# Patient Record
Sex: Female | Born: 1970 | Race: White | Hispanic: No | Smoking: Never smoker
Health system: Southern US, Community
[De-identification: ages and names within clinical notes are randomized; demographics above are authoritative.]

## PROBLEM LIST (undated history)

## (undated) DIAGNOSIS — C4491 Basal cell carcinoma of skin, unspecified: Secondary | ICD-10-CM

## (undated) DIAGNOSIS — C439 Malignant melanoma of skin, unspecified: Secondary | ICD-10-CM

## (undated) HISTORY — PX: BASAL CELL CARCINOMA EXCISION: SHX1214

## (undated) HISTORY — DX: Malignant melanoma of skin, unspecified: C43.9

## (undated) HISTORY — DX: Basal cell carcinoma of skin, unspecified: C44.91

---
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
## 1998-11-25 ENCOUNTER — Other Ambulatory Visit: Admission: RE | Admit: 1998-11-25 | Discharge: 1998-11-25 | Payer: Self-pay | Admitting: Gynecology

## 1999-06-10 ENCOUNTER — Inpatient Hospital Stay (HOSPITAL_COMMUNITY): Admission: AD | Admit: 1999-06-10 | Discharge: 1999-06-12 | Payer: Self-pay | Admitting: Gynecology

## 1999-06-22 ENCOUNTER — Ambulatory Visit (HOSPITAL_COMMUNITY): Admission: AD | Admit: 1999-06-22 | Discharge: 1999-06-22 | Payer: Self-pay | Admitting: Gynecology

## 1999-06-22 ENCOUNTER — Encounter (INDEPENDENT_AMBULATORY_CARE_PROVIDER_SITE_OTHER): Payer: Self-pay

## 1999-07-17 ENCOUNTER — Other Ambulatory Visit: Admission: RE | Admit: 1999-07-17 | Discharge: 1999-07-17 | Payer: Self-pay | Admitting: Gynecology

## 2001-06-13 ENCOUNTER — Other Ambulatory Visit: Admission: RE | Admit: 2001-06-13 | Discharge: 2001-06-13 | Payer: Self-pay | Admitting: Gynecology

## 2002-05-14 ENCOUNTER — Encounter: Admission: RE | Admit: 2002-05-14 | Discharge: 2002-05-14 | Payer: Self-pay | Admitting: Gynecology

## 2002-05-14 ENCOUNTER — Encounter: Payer: Self-pay | Admitting: Gynecology

## 2005-07-04 ENCOUNTER — Other Ambulatory Visit: Admission: RE | Admit: 2005-07-04 | Discharge: 2005-07-04 | Payer: Self-pay | Admitting: Gynecology

## 2007-08-14 HISTORY — PX: BREAST ENHANCEMENT SURGERY: SHX7

## 2009-06-03 ENCOUNTER — Other Ambulatory Visit: Admission: RE | Admit: 2009-06-03 | Discharge: 2009-06-03 | Payer: Self-pay | Admitting: Gynecology

## 2009-06-03 ENCOUNTER — Encounter: Payer: Self-pay | Admitting: Women's Health

## 2009-06-03 ENCOUNTER — Ambulatory Visit: Payer: Self-pay | Admitting: Women's Health

## 2009-06-17 ENCOUNTER — Encounter: Admission: RE | Admit: 2009-06-17 | Discharge: 2009-06-17 | Payer: Self-pay | Admitting: Gynecology

## 2009-09-01 ENCOUNTER — Ambulatory Visit: Payer: Self-pay | Admitting: Family Medicine

## 2009-11-09 ENCOUNTER — Ambulatory Visit: Payer: Self-pay | Admitting: Family Medicine

## 2010-01-03 ENCOUNTER — Encounter: Admission: RE | Admit: 2010-01-03 | Discharge: 2010-01-03 | Payer: Self-pay | Admitting: Gynecology

## 2011-11-13 ENCOUNTER — Other Ambulatory Visit: Payer: Self-pay | Admitting: Gynecology

## 2013-12-28 ENCOUNTER — Emergency Department (INDEPENDENT_AMBULATORY_CARE_PROVIDER_SITE_OTHER)
Admission: EM | Admit: 2013-12-28 | Discharge: 2013-12-28 | Disposition: A | Discharge status: Home / Self Care | Payer: BC Managed Care – PPO | Source: Home / Self Care | Attending: Emergency Medicine | Admitting: Emergency Medicine

## 2013-12-28 ENCOUNTER — Encounter (HOSPITAL_COMMUNITY): Payer: Self-pay | Admitting: Emergency Medicine

## 2013-12-28 ENCOUNTER — Emergency Department (INDEPENDENT_AMBULATORY_CARE_PROVIDER_SITE_OTHER): Payer: BC Managed Care – PPO

## 2013-12-28 DIAGNOSIS — S6000XA Contusion of unspecified finger without damage to nail, initial encounter: Secondary | ICD-10-CM

## 2013-12-28 DIAGNOSIS — S60111A Contusion of right thumb with damage to nail, initial encounter: Secondary | ICD-10-CM

## 2013-12-28 MED ORDER — HYDROCODONE-ACETAMINOPHEN 5-325 MG PO TABS
ORAL_TABLET | ORAL | Status: AC
  Filled 2013-12-28: qty 2

## 2013-12-28 MED ORDER — DOXYCYCLINE HYCLATE 100 MG PO TABS: 100.0000 mg | ORAL_TABLET | Freq: Two times a day (BID) | ORAL | Status: DC

## 2013-12-28 MED ORDER — HYDROCODONE-ACETAMINOPHEN 5-325 MG PO TABS
2.0000 | ORAL_TABLET | Freq: Once | ORAL | Status: AC
  Administered 2013-12-28: 2 via ORAL

## 2013-12-28 MED ORDER — HYDROCODONE-ACETAMINOPHEN 5-325 MG PO TABS: ORAL_TABLET | ORAL | Status: DC

## 2013-12-28 NOTE — ED Provider Notes (Signed)
Chief Complaint   Chief Complaint  Patient presents with  . Hand Injury    History of Present Illness   Diane Benjamin is a 43 year old female who slammed her right thumb in the [place] door [place] [employer] this past Thursday, 5 days ago. Ever since then she's had swelling and discoloration beneath the thumbnail. It hurts to move the thumb. She denies any fever or chills.  Review of Systems   Other than as noted above, the patient denies any of the following symptoms: Systemic:  No fevers or chills. Musculoskeletal:  No joint pain or arthritis.  Neurological:  No muscular weakness or paresthesias.  [place]   Past medical history, family history, social history, meds, and allergies were reviewed.     Physical Examination   Vital signs:  BP 120/80  Pulse 68  Temp(Src) 97 F (36.1 C) (Oral)  Resp 14  SpO2 100%  LMP 12/24/2013 Gen:  Alert and oriented times 3.  In no distress. Musculoskeletal:  Exam of the hand reveals there is a subungual hematoma. The proximal nail fold is swollen and erythematous. The nail is tender to touch. She has limited range of motion of the interphalangeal joint.  Otherwise, all joints had a full a ROM with no swelling, bruising or deformity.  No edema, pulses full. Extremities were warm and pink.  Capillary refill was brisk.  Skin:  Clear, warm and dry.  No rash. Neuro:  Alert and oriented times 3.  Muscle strength was normal.  Sensation was intact to light touch.   Radiology   Dg Finger Thumb Right  12/28/2013   CLINICAL DATA:  Right thumb injury and pain.  EXAM: RIGHT THUMB 2+V  COMPARISON:  None.  FINDINGS: There is no evidence of fracture or dislocation. There is no evidence of arthropathy or other focal bone abnormality. Soft tissues are unremarkable  IMPRESSION: Negative.   Electronically Signed   By: Hassan Rowan M.D.   On: 12/28/2013 20:09   I reviewed the images independently and personally and concur with the radiologist's findings.  Course in [facility] [place] was prepped with alcohol and 2 small holes were made at the base of the thumbnail draining a large amount of blood. There was no pus. Antibiotic ointment was applied and a sterile dressing. The thumb was put in a cage and wrapped in [place].  Assessment   The encounter diagnosis was Subungual hematoma of right thumb.  She will probably lose the nail. She was instructed in wound care. To avoid any further trauma to the nail. Return again if there is any sign of infection.  Plan  1.  Meds:  The following meds were prescribed:   Discharge Medication List as of 12/28/2013  8:26 PM    START taking these medications   Details  doxycycline (VIBRA-TABS) 100 MG tablet Take 1 tablet (100 mg total) by mouth 2 (two) times daily., Starting 12/28/2013, Until Discontinued, Normal    HYDROcodone-acetaminophen (NORCO/VICODIN) 5-325 MG per tablet 1 to 2 tabs every 4 to 6 hours as needed for pain., Print        2.  Patient Education/Counseling:  The patient was given appropriate handouts, self care instructions, and instructed in symptomatic relief, including rest and activity, and elevation.   3.  Follow up:  The patient was told to follow up here if no better in 3 to 4 days, or sooner if becoming worse in any way, and given some red flag symptoms  such as worsening pain, fever, swelling, or neurological symptoms which would prompt immediate return.        Harden Mo, MD 12/28/13 2049

## 2013-12-28 NOTE — ED Notes (Signed)
Report injury to right thumb.  States slammed thumb in car door Thursday night.  Having pain and swelling.  Hematoma noted.  No relief with otc pain meds.

## 2013-12-28 NOTE — Discharge Instructions (Signed)
Subungual Hematoma °A subungual hematoma is a pocket of blood that collects under the fingernail or toenail. The pressure created by the blood under the nail can cause pain. °CAUSES  °A subungual hematoma occurs when an injury to the finger or toe causes a blood vessel beneath the nail to break. The injury can occur from a direct blow such as slamming a finger in a door. It can also occur from a repeated injury such as pressure on the foot in a shoe while running. A subungual hematoma is sometimes called runner's toe or tennis toe. °SYMPTOMS  °· Blue or dark blue skin under the nail. °· Pain or throbbing in the injured area. °DIAGNOSIS  °Your caregiver can determine whether you have a subungual hematoma based on your history and a physical exam. If your caregiver thinks you might have a broken (fractured) bone, X-rays may be taken. °TREATMENT  °Hematomas usually go away on their own over time. Your caregiver may make a hole in the nail to drain the blood. Draining the blood is painless and usually provides significant relief from pain and throbbing. The nail usually grows back normally after this procedure. In some cases, the nail may need to be removed. This is done if there is a cut under the nail that requires stitches (sutures). °HOME CARE INSTRUCTIONS  °· Put ice on the injured area. °· Put ice in a plastic bag. °· Place a towel between your skin and the bag. °· Leave the ice on for 15-20 minutes, 03-04 times a day for the first 1 to 2 days. °· Elevate the injured area to help decrease pain and swelling. °· If you were given a bandage, wear it for as long as directed by your caregiver. °· If part of your nail falls off, trim the remaining nail gently. This prevents the nail from catching on something and causing further injury. °· Only take over-the-counter or prescription medicines for pain, discomfort, or fever as directed by your caregiver. °SEEK IMMEDIATE MEDICAL CARE IF:  °· You have redness or swelling  around the nail. °· You have yellowish-white fluid (pus) coming from the nail. °· Your pain is not controlled with medicine. °· You have a fever. °MAKE SURE YOU: °· Understand these instructions. °· Will watch your condition. °· Will get help right away if you are not doing well or get worse. °Document Released: 07/27/2000 Document Revised: 10/22/2011 Document Reviewed: 07/18/2011 °ExitCare® Patient Information ©2014 [employer]. ° °

## 2015-02-11 ENCOUNTER — Ambulatory Visit: Payer: Self-pay | Admitting: Women's Health

## 2015-03-04 ENCOUNTER — Ambulatory Visit: Payer: Self-pay | Admitting: Women's Health

## 2015-03-29 ENCOUNTER — Other Ambulatory Visit (HOSPITAL_COMMUNITY)
Admission: RE | Admit: 2015-03-29 | Discharge: 2015-03-29 | Disposition: A | Discharge status: Home / Self Care | Payer: BC Managed Care – PPO | Source: Ambulatory Visit | Attending: Women's Health | Admitting: Women's Health

## 2015-03-29 ENCOUNTER — Encounter: Payer: Self-pay | Admitting: Women's Health

## 2015-03-29 ENCOUNTER — Ambulatory Visit (INDEPENDENT_AMBULATORY_CARE_PROVIDER_SITE_OTHER): Payer: BC Managed Care – PPO | Admitting: Women's Health

## 2015-03-29 VITALS — BP 126/80 | Ht 64.0 in | Wt 113.0 lb

## 2015-03-29 DIAGNOSIS — Z1151 Encounter for screening for human papillomavirus (HPV): Secondary | ICD-10-CM | POA: Insufficient documentation

## 2015-03-29 DIAGNOSIS — Z01419 Encounter for gynecological examination (general) (routine) without abnormal findings: Secondary | ICD-10-CM | POA: Diagnosis not present

## 2015-03-29 DIAGNOSIS — Z13220 Encounter for screening for lipoid disorders: Secondary | ICD-10-CM | POA: Diagnosis not present

## 2015-03-29 DIAGNOSIS — Z23 Encounter for immunization: Secondary | ICD-10-CM | POA: Diagnosis not present

## 2015-03-29 NOTE — Progress Notes (Signed)
Diane Benjamin 1970/08/30 [id number]    History:    Presents for annual exam.  Minimal healthcare for the past few years. History of normal Paps. Has not had a mammogram. Regular monthly cycle withdrawal for contraception. Occasionally will have brown odorless, non-irritating, painless discharge about 5 days prior to cycle. Mother breast cancer age 44 with recurrence at age 12 is planning to have BRCA testing. Sister  had negative BRCA testing.  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 Originally from [place], went to [place]..  ROS:  A ROS was performed and pertinent positives and negatives are included.  Exam:  Filed Vitals:   03/29/15 1544  BP: 126/80    General appearance:  Normal Thyroid:  Symmetrical, normal in size, without palpable masses or nodularity. Respiratory  Auscultation:  Clear without wheezing or rhonchi Cardiovascular  Auscultation:  Regular rate, without rubs, murmurs or gallops  Edema/varicosities:  Not grossly evident Abdominal  Soft,nontender, without masses, guarding or rebound.  Liver/spleen:  No organomegaly noted  Hernia:  None appreciated  Skin  Inspection:  Grossly normal   Breasts: Examined lying and sitting/bilateral saline implants.     Right: Without masses, retractions, discharge or axillary adenopathy.     Left: Without masses, retractions, discharge or axillary adenopathy. Gentitourinary   Inguinal/mons:  Normal without inguinal adenopathy  External genitalia:  Normal  BUS/Urethra/Skene's glands:  Normal  Vagina:  Normal no discharge visible.  Cervix:  Normal  Uterus:  normal in size, shape and contour.  Midline and mobile  Adnexa/parametria:     Rt: Without masses or tenderness.   Lt: Without masses or tenderness.  Anus and perineum: Normal  Digital rectal exam: Normal sphincter tone without  palpated masses or tenderness  Assessment/Plan:  44 y.o. M WF G2 P2 for annual exam.   Monthly cycle/withdrawal/occasional brown discharge week prior to cycle  Plan: Options reviewed, sonohysterogram, watch, reviewed if red/bloody discharge/spotting persist best to proceed with a sonohysterogram. Will call if continues. SBE's, reviewed importance of annual screening mammogram, 3-D tomography reviewed and encouraged. Contraception reviewed, declines will continue with withdrawal. Continue active lifestyle, sunscreens encouraged, regular exercise, calcium rich diet, vitamin D 1000 daily encouraged. CBC, glucose, lipid panel, TSH, UA, Pap with HR HPV typing. New screening guidelines reviewed. T dap given.  [place], 4:59 PM 03/29/2015

## 2015-03-29 NOTE — Patient Instructions (Signed)

## 2015-03-30 LAB — URINALYSIS W MICROSCOPIC + REFLEX CULTURE
Bilirubin Urine: NEGATIVE
CASTS: NONE SEEN [LPF]
CRYSTALS: NONE SEEN [HPF]
Glucose, UA: NEGATIVE
Hgb urine dipstick: NEGATIVE
KETONES UR: NEGATIVE
NITRITE: NEGATIVE
PH: 6 (ref 5.0–8.0)
Protein, ur: NEGATIVE
RBC / HPF: NONE SEEN RBC/HPF (ref <=2)
SPECIFIC GRAVITY, URINE: 1.007 (ref 1.001–1.035)
YEAST: NONE SEEN [HPF]

## 2015-03-30 LAB — CBC WITH DIFFERENTIAL/PLATELET
BASOS ABS: 0.1 10*3/uL (ref 0.0–0.1)
Basophils Relative: 1 % (ref 0–1)
EOS ABS: 0.2 10*3/uL (ref 0.0–0.7)
EOS PCT: 3 % (ref 0–5)
HEMATOCRIT: 39.7 % (ref 36.0–46.0)
Hemoglobin: 13.2 g/dL (ref 12.0–15.0)
LYMPHS PCT: 26 % (ref 12–46)
Lymphs Abs: 1.6 10*3/uL (ref 0.7–4.0)
MCH: 30.6 pg (ref 26.0–34.0)
MCHC: 33.2 g/dL (ref 30.0–36.0)
MCV: 91.9 fL (ref 78.0–100.0)
MPV: 10.5 fL (ref 8.6–12.4)
Monocytes Absolute: 0.4 10*3/uL (ref 0.1–1.0)
Monocytes Relative: 6 % (ref 3–12)
NEUTROS PCT: 64 % (ref 43–77)
Neutro Abs: 3.8 10*3/uL (ref 1.7–7.7)
PLATELETS: 228 10*3/uL (ref 150–400)
RBC: 4.32 MIL/uL (ref 3.87–5.11)
RDW: 13.6 % (ref 11.5–15.5)
WBC: 6 10*3/uL (ref 4.0–10.5)

## 2015-03-30 LAB — LIPID PANEL
CHOLESTEROL: 154 mg/dL (ref 125–200)
HDL: 65 mg/dL (ref >=46)
LDL Cholesterol: 74 mg/dL (ref <130)
TRIGLYCERIDES: 76 mg/dL (ref <150)
Total CHOL/HDL Ratio: 2.4 Ratio (ref <=5.0)
VLDL: 15 mg/dL (ref <30)

## 2015-03-30 LAB — GLUCOSE, RANDOM: Glucose, Bld: 86 mg/dL (ref 65–99)

## 2015-03-30 LAB — TSH: TSH: 2.288 u[IU]/mL (ref 0.350–4.500)

## 2015-03-31 LAB — CYTOLOGY - PAP

## 2015-03-31 LAB — URINE CULTURE

## 2015-05-27 ENCOUNTER — Other Ambulatory Visit: Payer: Self-pay

## 2015-05-27 DIAGNOSIS — Z1231 Encounter for screening mammogram for malignant neoplasm of breast: Secondary | ICD-10-CM

## 2015-06-24 ENCOUNTER — Ambulatory Visit
Admission: RE | Admit: 2015-06-24 | Discharge: 2015-06-24 | Disposition: A | Discharge status: Home / Self Care | Payer: BC Managed Care – PPO | Source: Ambulatory Visit

## 2015-06-24 DIAGNOSIS — Z1231 Encounter for screening mammogram for malignant neoplasm of breast: Secondary | ICD-10-CM

## 2016-12-26 ENCOUNTER — Encounter: Payer: Self-pay | Admitting: Gynecology

## 2017-01-21 ENCOUNTER — Ambulatory Visit (INDEPENDENT_AMBULATORY_CARE_PROVIDER_SITE_OTHER): Payer: BC Managed Care – PPO | Admitting: Medical

## 2017-01-21 ENCOUNTER — Encounter: Payer: Self-pay | Admitting: Medical

## 2017-01-21 VITALS — BP 108/62 | HR 64 | Temp 98.4°F | Ht 63.0 in | Wt 121.0 lb

## 2017-01-21 DIAGNOSIS — L559 Sunburn, unspecified: Secondary | ICD-10-CM | POA: Diagnosis not present

## 2017-01-21 DIAGNOSIS — W57XXXA Bitten or stung by nonvenomous insect and other nonvenomous arthropods, initial encounter: Secondary | ICD-10-CM | POA: Diagnosis not present

## 2017-01-21 NOTE — Progress Notes (Signed)
Subjective: Chief Complaint  Patient presents with  . New Patient (Initial Visit)    tick bite  swelling on rt hip , no fever,no vomitting, no nausea    Here as a new patient today.  Used to come here in the past, saw Dr. Redmond [facility] in the past 8-10 years ago.   Bit by tick over the weekend.  She was out near the wooded area the night before, so she thinks the tick was there less than 24 hours.   Pulled the tick off as soon as she noticed it.  Tick was on right hip area, and has localized puffiness in right groin.  Has the tick in a zip lock bag today.   Denies fever, joint aches, fatigue.  She is sore, but thinks its from walking a lot this past week during EOGs.    Has sunburn, was out in sun a lot yesterday and today.     Is an Statistician at [employer], teaches PE.    Has 46yo daughter and 74yo son, married.  No urinary or bowel or vaginal c/o.   No other aggravating or relieving factors. No other complaint.  Of note, she has had several mammograms.   Mother has had breast cancer twice, first time diagnosed in her 55s.  She is in her 49s now.  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 No pertinent past medical history.   No current outpatient prescriptions on file prior to visit.   No current facility-administered medications on file prior to visit.     ROS as in subjective  Objective: BP 108/62   Pulse 64   Temp 98.4 F (36.9 C)   Ht 5\' 3"  (1.6 m)   Wt 121 lb (54.9 kg)   BMI 21.43 kg/m   Gen: wd, wn, nad, lean white female Right anterior hip with small 0.5 cm diameter raised erythematous area with central 54mm skin patch missing from where tick was pulled off.    Similar lesion right posterior back.   There is slight puffiness and shoddy mildly tender lymph nodes palpable in right inguinal region Mild sun burn on arms, neck,  face   Assessment: Encounter Diagnoses  Name Primary?  . Tick bite, initial encounter Yes  . Sunburn     Plan: Discussed concerns, symptoms, exam findings.   We will use watchful waiting regarding tick born illness, but currently no symptoms of tick borne illness.  Recommendations:  You can use OTC hydrocortisone cream topically to the bites for the next week  Begin Benadryl tablet OTC 25mg  at night, and possible 12.5mg  (1/2 tablet ) benadryl in the day  You can use a combination of ice/heat to the tick bite areas  You can use OTC ibuprofen 2-3 tablets twice daily for a week for the localized lymph node swelling  If you develop fever, joint aches, joint swelling, fatigue, or new rash, then call or recheck right away  Diane Benjamin was seen today for new patient (initial visit).  Diagnoses and all orders for this visit:  Tick bite, initial encounter  Sunburn

## 2017-01-21 NOTE — Patient Instructions (Addendum)
Recommendations:  You can use OTC hydrocortisone cream topically to the bites for the next week  Begin Benadryl tablet OTC 25mg  at night, and possible 12.5mg  (1/2 tablet ) benadryl in the day  You can use a combination of ice/heat to the tick bite areas  You can use OTC ibuprofen 2-3 tablets twice daily for a week for the localized lymph node swelling  If you develop fever, joint aches, joint swelling, fatigue, or new rash, then call or recheck right away     Lyme Disease Lyme disease is an infection that affects many parts of the body, including the skin, joints, and nervous system. It is a bacterial infection that starts from the bite of an infected tick. The infection can spread, and some of the symptoms are similar to the flu. If Lyme disease is not treated, it may cause joint pain, swelling, numbness, problems thinking, fatigue, muscle weakness, and other problems. What are the causes? This condition is caused by bacteria called Borrelia burgdorferi. You can get Lyme disease by being bitten by an infected tick. The tick must be attached to your skin to pass along the infection. Deer often carry infected ticks. What increases the risk? The following factors may make you more likely to develop this condition:  Living in or visiting these areas in the [place] ? [employer]. ? The [place]. ? The [place].  Spending time in wooded or grassy areas.  Being outdoors with exposed skin.  Camping, gardening, hiking, fishing, or hunting outdoors.  Failing to remove a tick from your skin within 3-4 days.  What are the signs or symptoms? Symptoms of this condition include:  A round, red rash that surrounds the center of the tick bite. This is the first sign of infection. The center of the rash may be blood colored or have tiny blisters.  Fatigue.  Headache.  Chills and fever.  General achiness.  Joint pain, often in the knees.  Muscle pain.  Swollen lymph  glands.  Stiff neck.  How is this diagnosed? This condition is diagnosed based on:  Your symptoms and medical history.  A physical exam.  A blood test.  How is this treated? The main treatment for this condition is antibiotic medicine, which is usually taken by mouth (orally). The length of treatment depends on how soon after a tick bite you begin taking the medicine. In some cases, treatment is necessary for several weeks. If the infection is severe, antibiotics may need to be given through an IV tube that is inserted into one of your veins. Follow these instructions at home:  Take your antibiotic medicine as told by your health care provider. Do not stop taking the antibiotic even if you start to feel better.  Ask your health care provider about takinga probiotic in between doses of your antibiotic to help avoid stomach upset or diarrhea.  Check with your health care provider before supplementing your treatment. Many alternative therapies have not been proven and may be harmful to you.  Keep all follow-up visits as told by your health care provider. This is important. How is this prevented? You can become reinfected if you get another tick bite from an infected tick. Take these steps to help prevent an infection:  Cover your skin with light-colored clothing when you are outdoors in the spring and summer months.  Spray clothing and skin with bug spray. The spray should be 20-30% DEET.  Avoid wooded, grassy, and shaded areas.  Remove yard  litter, brush, trash, and plants that attract deer and rodents.  Check yourself for ticks when you come indoors.  Wash clothing worn each day.  Check your pets for ticks before they come inside.  If you find a tick: ? Remove it with tweezers. ? Clean your hands and the bite area with rubbing alcohol or soap and water.  Pregnant women should take special care to avoid tick bites because the infection can be passed along to the  fetus. Contact a health care provider if:  You have symptoms after treatment.  You have removed a tick and want to bring it to your health care provider for testing. Get help right away if:  You have an irregular heartbeat.  You have nerve pain.  Your face feels numb. This information is not intended to replace advice given to you by your health care provider. Make sure you discuss any questions you have with your health care provider. Document Released: 11/05/2000 Document Revised: 03/20/2016 Document Reviewed: 03/20/2016 Elsevier Interactive Patient Education  2017 [place] Spotted Fever [facility] spotted fever is an illness that is spread to people by infected ticks. The illness causes flulike symptoms and a reddish-purple rash. This illness can quickly become very serious. Treatment must be started right away. When the illness is not treated right away, it can sometimes lead to long-term health problems or even death. This illness is most common during warm weather when ticks are most active. What are the causes? [facility] spotted fever is caused by a type of bacteria that is called Rickettsia rickettsii. This type of bacteria is carried by [place] dog ticks and [employer]. People get infected through a bite from a tick that is infected with the bacteria. The bite is painless, and it frequently goes unnoticed. The bacteria can also infect a person when tick blood or tick feces get into a person's body through damaged skin. A tick bite is not necessary for an infection to occur. People can get [facility] spotted fever if they get a tick's blood or body fluids on their skin in the area of a small cut or sore. This could happen while removing a tick from another person or a dog. The infection is not contagious, and it cannot be spread (transmitted) from person to person. What are the signs or symptoms? Symptoms may begin 2-14 days  after a tick bite. The most common early symptoms are:  Fever.  Muscle aches.  Headache.  Nausea.  Vomiting.  Poor appetite.  Abdominal pain.  The reddish-purple rash usually appears 3-5 days after the first symptoms begin. The rash often starts on the wrists and ankles. It may then spread to the palms, the soles of the feet, the legs, and the trunk. How is this diagnosed? Diagnosis is based on a physical exam, medical history, and blood tests. Your health care provider may suspect [facility] spotted fever in one of these cases:  If you have recently been bitten by a tick.  If you have been in areas that have a lot of ticks or in areas where the disease is common.  How is this treated? It is important to begin treatment right away. Treatment will usually involve the use of antibiotic medicines. In some cases, your health care provider may begin treatment before the diagnosis is confirmed. If your symptoms are severe, a hospital stay may be needed. Follow these instructions at home:  Rest as  much as possible until you feel better.  Take medicines only as directed by your health care provider.  Take your antibiotic medicine as directed by your health care provider. Finish the antibiotic even if you start to feel better.  Drink enough fluid to keep your urine clear or pale yellow.  Keep all follow-up visits as directed by your health care provider. This is important. How is this prevented? Avoiding tick bites can help to prevent this illness. Take these steps to avoid tick bites when you are outdoors:  Be aware that most ticks live in shrubs, low tree branches, and grassy areas. A tick can climb onto your body when you make contact with leaves or grass where the tick is waiting.  Wear protective clothing. Long sleeves and long pants are best.  Wear white clothes so you can see ticks more easily.  Tuck your pant legs into your socks.  If you go walking on a trail, stay  in the middle of the trail to avoid brushing against bushes.  Avoid walking through areas that have long grass.  Put insect repellent on all exposed skin and along boot tops, pant legs, and sleeve cuffs.  Check clothing, hair, and skin repeatedly and before going inside.  Check family members and pets for ticks.  Brush off any ticks that are not attached.  Take a shower or a bath as soon as possible after you have been outdoors. Check your skin for ticks. The most common places on the body where ticks attach themselves are the scalp, neck, armpits, waist, and groin.  You can also greatly reduce your chances of getting [facility] spotted fever if you remove attached ticks as soon as possible. To remove an attached tick, use a forceps or fine-point tweezers to detach the intact tick without leaving its mouth parts in the skin. The wound from the tick bite should be washed after the tick has been removed. Contact a health care provider if:  You have drainage, swelling, or increased redness or pain in the area of the rash. Get help right away if:  You have chest pain.  You have shortness of breath.  You have a severe headache.  You have a seizure.  You have severe abdominal pain.  You are feeling confused.  You are bruising easily.  You have bleeding from your gums.  You have blood in your stool. This information is not intended to replace advice given to you by your health care provider. Make sure you discuss any questions you have with your health care provider. Document Released: 11/11/2000 Document Revised: 01/05/2016 Document Reviewed: 03/15/2014 Elsevier Interactive Patient Education  2018 [employer].

## 2017-08-28 ENCOUNTER — Telehealth: Payer: Self-pay | Admitting: Medical

## 2017-08-28 ENCOUNTER — Ambulatory Visit: Payer: BC Managed Care – PPO | Admitting: Medical

## 2017-08-28 VITALS — BP 110/68 | HR 78 | Temp 98.3°F | Wt 122.2 lb

## 2017-08-28 DIAGNOSIS — I889 Nonspecific lymphadenitis, unspecified: Secondary | ICD-10-CM | POA: Diagnosis not present

## 2017-08-28 DIAGNOSIS — Z8349 Family history of other endocrine, nutritional and metabolic diseases: Secondary | ICD-10-CM | POA: Diagnosis not present

## 2017-08-28 DIAGNOSIS — J0110 Acute frontal sinusitis, unspecified: Secondary | ICD-10-CM | POA: Diagnosis not present

## 2017-08-28 DIAGNOSIS — R059 Cough, unspecified: Secondary | ICD-10-CM

## 2017-08-28 DIAGNOSIS — R05 Cough: Secondary | ICD-10-CM | POA: Diagnosis not present

## 2017-08-28 DIAGNOSIS — R5383 Other fatigue: Secondary | ICD-10-CM | POA: Insufficient documentation

## 2017-08-28 MED ORDER — BENZONATATE 200 MG PO CAPS: 200.0000 mg | ORAL_CAPSULE | Freq: Three times a day (TID) | ORAL | 0 refills | Status: DC | PRN

## 2017-08-28 MED ORDER — AMOXICILLIN 875 MG PO TABS: 875.0000 mg | ORAL_TABLET | Freq: Two times a day (BID) | ORAL | 0 refills | Status: DC

## 2017-08-28 NOTE — Telephone Encounter (Signed)
Called pt per Shane's instruction and left message to call our office to let us know if she needs a work note her for today. We can provide a note per Audelia Acton.

## 2017-08-28 NOTE — Progress Notes (Signed)
Subjective: Chief Complaint  Patient presents with  . Sinus Problem    sinus pressure in head, coughing, chest congestion , coughing up mucus ,x1 month    Here for month long hx/o cough, some headache, low energy, coughing for about a month, productive last few days.  Last few days worse headache and sinus pressure, upper teeth aches, some chills and body aches.  Started as sore throat and chest hurts from all the coughing.   She notes years ago having a cough so bad she broke a rib.   No NV.  Had some loose stool few weeks ago.  Has recently used Alka seltzer, Theraflu, delsym, robitussin.  Had sick contact over Christmas.     I saw her last year for a tick bite.  Since then she continues to have a small area of puffiness in her right inguinal region.  But she never had rash or fever or significant signs of Lyme disease after I saw her  However she does note several month history of fatigue, has some family history of thyroid disease including first-degree relatives.  Her periods have slowed down in the last year, last period months ago but does not think she is pregnant and has had negative pregnancy test at home.  She does get hot flashes.  She has had puffiness in achiness behind the knees from time to time  She denies fever, weight loss, night sweats.  No past medical history on file.   No current outpatient medications on file prior to visit.   No current facility-administered medications on file prior to visit.    ROS as in subjective    Objective: BP 110/68   Pulse 78   Temp 98.3 F (36.8 C)   Wt 122 lb 3.2 oz (55.4 kg)   SpO2 97%   BMI 21.65 kg/m   Wt Readings from Last 3 Encounters:  08/28/17 122 lb 3.2 oz (55.4 kg)  01/21/17 121 lb (54.9 kg)  03/29/15 113 lb (51.3 kg)   General appearance: Alert, WD/WN, no distress                             Skin: warm, no rash                           Head: + frontal sinus tenderness,                            Eyes:  conjunctiva normal, corneas clear, PERRLA                            Ears: pearly TMs, external ear canals normal                          Nose: septum midline, turbinates swollen, with erythema and clear discharge             Mouth/throat: MMM, tongue normal, mild pharyngeal erythema                           Neck: supple,shoddy tender nodes, no thyromegaly, non tender                          Heart: RRR, normal S1,  S2, no murmurs                         Lungs: CTA bilaterally, no wheezes, rales, or rhonchi    chest wall: tender over anterior lower ribs bilat, no deformity, no bruising or redness  ext: no edema Pulses WNL There is a small palpable 4-5 mm diameter lymph node in right inguinal region that causes a little puffiness but she is very lean making this easy to see.  No enlarged node.   Exam chaperoned by nurse   Assessment: Encounter Diagnoses  Name Primary?  . Cough Yes  . Acute non-recurrent frontal sinusitis   . Lymphadenitis   . Fatigue, unspecified type   . Family history of thyroid disease     Plan: We discussed her symptoms and concerns.  Her current symptoms do suggest sinus infection postnasal drainage and cough.  We discussed possibly getting an x-ray but she declines today.  She will begin a round of amoxicillin, discussed possible cross reaction given she has a prior allergy to Keflex.  Advised rest, hydration, over-the-counter analgesic, can use Tessalon as needed  If still coughing after 10 days of more than we can get an x-ray if not much improved  Fatigue, lymphadenitis, family history of thyroid disease-labs today  The right inguinal lymph node is small and likely residual from the tick bite but not with current major concern for other pathology.  She does have plans to follow-up with her gynecologist for updated Pap smear soon, she denies any other worrisome symptoms.  Diane Benjamin was seen today for sinus problem.  Diagnoses and all orders for this  visit:  Cough -     CBC with Differential/Platelet -     Comprehensive metabolic panel  Acute non-recurrent frontal sinusitis  Lymphadenitis -     CBC with Differential/Platelet  Fatigue, unspecified type -     CBC with Differential/Platelet -     Comprehensive metabolic panel -     TSH -     VITAMIN D 25 Hydroxy (Vit-D Deficiency, Fractures)  Family history of thyroid disease  Other orders -     amoxicillin (AMOXIL) 875 MG tablet; Take 1 tablet (875 mg total) by mouth 2 (two) times daily. -     benzonatate (TESSALON) 200 MG capsule; Take 1 capsule (200 mg total) by mouth 3 (three) times daily as needed for cough.

## 2017-08-29 LAB — CBC WITH DIFFERENTIAL/PLATELET
Basophils Absolute: 0 10*3/uL (ref 0.0–0.2)
Basos: 1 %
EOS (ABSOLUTE): 0.1 10*3/uL (ref 0.0–0.4)
EOS: 2 %
HEMATOCRIT: 39.1 % (ref 34.0–46.6)
Hemoglobin: 12.7 g/dL (ref 11.1–15.9)
IMMATURE GRANS (ABS): 0 10*3/uL (ref 0.0–0.1)
Immature Granulocytes: 0 %
LYMPHS ABS: 1.5 10*3/uL (ref 0.7–3.1)
Lymphs: 30 %
MCH: 29.9 pg (ref 26.6–33.0)
MCHC: 32.5 g/dL (ref 31.5–35.7)
MCV: 92 fL (ref 79–97)
Monocytes Absolute: 0.3 10*3/uL (ref 0.1–0.9)
Monocytes: 7 %
Neutrophils Absolute: 3.2 10*3/uL (ref 1.4–7.0)
Neutrophils: 60 %
Platelets: 247 10*3/uL (ref 150–379)
RBC: 4.25 x10E6/uL (ref 3.77–5.28)
RDW: 13.2 % (ref 12.3–15.4)
WBC: 5.2 10*3/uL (ref 3.4–10.8)

## 2017-08-29 LAB — COMPREHENSIVE METABOLIC PANEL
A/G RATIO: 1.8 (ref 1.2–2.2)
ALBUMIN: 4.6 g/dL (ref 3.5–5.5)
ALK PHOS: 56 IU/L (ref 39–117)
ALT: 13 IU/L (ref 0–32)
AST: 18 IU/L (ref 0–40)
BUN/Creatinine Ratio: 24 — ABNORMAL HIGH (ref 9–23)
BUN: 17 mg/dL (ref 6–24)
Bilirubin Total: 0.4 mg/dL (ref 0.0–1.2)
CHLORIDE: 101 mmol/L (ref 96–106)
CO2: 24 mmol/L (ref 20–29)
Calcium: 9.5 mg/dL (ref 8.7–10.2)
Creatinine, Ser: 0.72 mg/dL (ref 0.57–1.00)
GFR calc non Af Amer: 101 mL/min/{1.73_m2} (ref >59)
GFR, EST AFRICAN AMERICAN: 116 mL/min/{1.73_m2} (ref >59)
GLOBULIN, TOTAL: 2.5 g/dL (ref 1.5–4.5)
GLUCOSE: 92 mg/dL (ref 65–99)
Potassium: 4.4 mmol/L (ref 3.5–5.2)
SODIUM: 140 mmol/L (ref 134–144)
TOTAL PROTEIN: 7.1 g/dL (ref 6.0–8.5)

## 2017-08-29 LAB — TSH: TSH: 2.05 u[IU]/mL (ref 0.450–4.500)

## 2017-08-29 LAB — VITAMIN D 25 HYDROXY (VIT D DEFICIENCY, FRACTURES): Vit D, 25-Hydroxy: 38.2 ng/mL (ref 30.0–100.0)

## 2017-09-05 ENCOUNTER — Other Ambulatory Visit: Payer: Self-pay | Admitting: Medical

## 2017-09-05 ENCOUNTER — Telehealth: Payer: Self-pay | Admitting: Medical

## 2017-09-05 DIAGNOSIS — R059 Cough, unspecified: Secondary | ICD-10-CM

## 2017-09-05 DIAGNOSIS — R05 Cough: Secondary | ICD-10-CM

## 2017-09-05 MED ORDER — DOXYCYCLINE HYCLATE 100 MG PO TABS: 100.0000 mg | ORAL_TABLET | Freq: Two times a day (BID) | ORAL | 0 refills | Status: DC

## 2017-09-05 MED ORDER — PROMETHAZINE-DM 6.25-15 MG/5ML PO SYRP: 5.0000 mL | ORAL_SOLUTION | Freq: Four times a day (QID) | ORAL | 0 refills | Status: DC | PRN

## 2017-09-05 MED ORDER — PREDNISONE 10 MG PO TABS: ORAL_TABLET | ORAL | 0 refills | Status: DC

## 2017-09-05 NOTE — Telephone Encounter (Signed)
Pt called back and states that she is still not feeling good, she is still taking the antibiotic  she has 2 days left, she is still coughing, congested, has the face pressure, pain in the left rib, is having a hard time breathing from being so stopped up, states she feels the same as when she was in here. She states that the tessalon pills help somewhat, pt is not for sure if she needs something else to take, pt uses [id number] [place], [place] and pt can be reached at [phone]

## 2017-09-05 NOTE — Telephone Encounter (Signed)
Called pt and l/m for pt to  call us back

## 2017-09-05 NOTE — Telephone Encounter (Signed)
Spoke with pt and notified her meds and x-ray .

## 2017-09-05 NOTE — Telephone Encounter (Signed)
If still a lot of cough, send for CXR.  Order for xray done  I sent 3 things to pharmacy, cough syrup, different round of antibiotic and finish the amoxicillin she is taking, and gave round of steroid taper which helps reduce inflammation and congestion in chest and sinuses.

## 2017-09-06 ENCOUNTER — Ambulatory Visit
Admission: RE | Admit: 2017-09-06 | Discharge: 2017-09-06 | Disposition: A | Discharge status: Home / Self Care | Payer: BC Managed Care – PPO | Source: Ambulatory Visit | Attending: Medical | Admitting: Medical

## 2017-09-06 DIAGNOSIS — R059 Cough, unspecified: Secondary | ICD-10-CM

## 2017-09-06 DIAGNOSIS — R05 Cough: Secondary | ICD-10-CM

## 2019-02-16 ENCOUNTER — Encounter: Payer: Self-pay | Admitting: Physician Assistant

## 2019-02-16 ENCOUNTER — Telehealth: Payer: BC Managed Care – PPO | Admitting: Physician Assistant

## 2019-02-16 DIAGNOSIS — R059 Cough, unspecified: Secondary | ICD-10-CM

## 2019-02-16 DIAGNOSIS — Z20822 Contact with and (suspected) exposure to covid-19: Secondary | ICD-10-CM

## 2019-02-16 DIAGNOSIS — R197 Diarrhea, unspecified: Secondary | ICD-10-CM

## 2019-02-16 DIAGNOSIS — R05 Cough: Secondary | ICD-10-CM

## 2019-02-16 MED ORDER — BENZONATATE 100 MG PO CAPS: 100.0000 mg | ORAL_CAPSULE | Freq: Two times a day (BID) | ORAL | 0 refills | Status: DC | PRN

## 2019-02-16 NOTE — Progress Notes (Signed)
E-Visit for Corona Virus Screening   Your current symptoms could be consistent with the coronavirus.  Call your health care provider or local health department to request and arrange formal testing. Many health care providers can now test patients at their office but not all are.  Please quarantine yourself while awaiting your test results.  [facility] [phone], [place], [place] [phone] or visit [email or web address]  and You have been enrolled in [facility] for COVID-19.  Daily you will receive a questionnaire within the [place] website. Our COVID-19 response team will be monitoring your responses daily.    COVID-19 is a respiratory illness with symptoms that are similar to the flu. Symptoms are typically mild to moderate, but there have been cases of severe illness and death due to the virus. The following symptoms may appear 2-14 days after exposure: . Fever . Cough . Shortness of breath or difficulty breathing . Chills . Repeated shaking with chills . Muscle pain . Headache . Sore throat . New loss of taste or smell . Fatigue . Congestion or runny nose . Nausea or vomiting . Diarrhea  It is vitally important that if you feel that you have an infection such as this virus or any other virus that you stay home and away from places where you may spread it to others.  You should self-quarantine for 14 days if you have symptoms that could potentially be coronavirus or have been in close contact a with a person diagnosed with COVID-19 within the last 2 weeks. You should avoid contact with people age 45 and older.   You should wear a mask or cloth face covering over your nose and mouth if you must be around other people or animals, including pets (even at home). Try to stay at least 6 feet away from other people. This will protect the  people around you.  You can use medication such as A prescription cough medication called Tessalon Perles 100 mg. You may take 1-2 capsules every 8 hours as needed for cough  You may also take acetaminophen (Tylenol) as needed for fever.  I have provided a work note   Reduce your risk of any infection by using the same precautions used for avoiding the common cold or flu:  Marland Kitchen Wash your hands often with soap and warm water for at least 20 seconds.  If soap and water are not readily available, use an alcohol-based hand sanitizer with at least 60% alcohol.  . If coughing or sneezing, cover your mouth and nose by coughing or sneezing into the elbow areas of your shirt or coat, into a tissue or into your sleeve (not your hands). . Avoid shaking hands with others and consider head nods or verbal greetings only. . Avoid touching your eyes, nose, or mouth with unwashed hands.  . Avoid close contact with people who are sick. . Avoid places or events with large numbers of people in one location, like concerts or sporting events. . Carefully consider travel plans you have or are making. . If you are planning any travel outside or inside the [place], visit the CDC's Travelers' Health webpage for the latest health notices. . If you have some symptoms but not all symptoms, continue to monitor at home and seek medical attention if your symptoms worsen. . If you are having a medical emergency, call 911.  HOME CARE . Only take medications as instructed by your medical team. . Drink plenty  of fluids and get plenty of rest. . A steam or ultrasonic humidifier can help if you have congestion.   GET HELP RIGHT AWAY IF YOU HAVE EMERGENCY WARNING SIGNS** FOR COVID-19. If you or someone is showing any of these signs seek emergency medical care immediately. Call 911 or proceed to your closest emergency facility if: . You develop worsening high fever. . Trouble breathing . Bluish lips or face . Persistent pain or  pressure in the chest . New confusion . Inability to wake or stay awake . You cough up blood. . Your symptoms become more severe  **This list is not all possible symptoms. Contact your medical provider for any symptoms that are sever or concerning to you.   MAKE SURE YOU   Understand these instructions.  Will watch your condition.  Will get help right away if you are not doing well or get worse.  Your e-visit answers were reviewed by a board certified advanced clinical practitioner to complete your personal care plan.  Depending on the condition, your plan could have included both over the counter or prescription medications.  If there is a problem please reply once you have received a response from your provider.  Your safety is important to [place].  If you have drug allergies check your prescription carefully.    You can use MyChart to ask questions about today's visit, request a non-urgent call back, or ask for a work or school excuse for 24 hours related to this e-Visit. If it has been greater than 24 hours you will need to follow up with your provider, or enter a new e-Visit to address those concerns. You will get an e-mail in the next two days asking about your experience.  I hope that your e-visit has been valuable and will speed your recovery. Thank you for using e-visits.   I spent 5-10 minutes on review and completion of this note- Lacy Duverney [facility]

## 2019-11-30 ENCOUNTER — Other Ambulatory Visit: Payer: Self-pay

## 2019-12-01 ENCOUNTER — Encounter: Payer: Self-pay | Admitting: Women's Health

## 2019-12-01 ENCOUNTER — Ambulatory Visit: Payer: BC Managed Care – PPO | Admitting: Women's Health

## 2019-12-01 VITALS — BP 126/80 | Ht 64.0 in | Wt 107.0 lb

## 2019-12-01 DIAGNOSIS — R5383 Other fatigue: Secondary | ICD-10-CM

## 2019-12-01 DIAGNOSIS — Z01419 Encounter for gynecological examination (general) (routine) without abnormal findings: Secondary | ICD-10-CM | POA: Diagnosis not present

## 2019-12-01 DIAGNOSIS — Z113 Encounter for screening for infections with a predominantly sexual mode of transmission: Secondary | ICD-10-CM

## 2019-12-01 NOTE — Progress Notes (Signed)
   Diane Benjamin [birth year] [id number]   History:  49 y.o. separated WF G2, P2 presents for annual exam. Last here 2016. Normal Pap and mammogram history, last mammogram 2016. Cycles were monthly and  regular but has not had a cycle for over 2 years occasional hot flash. States has had a rough few years, husband left, moved away and left her with  debt. Graduated from [employer] from [place],  family there and her 2 children son  7, daughter 61 live here. Mother breast cancer age 71 recurrence at age 32 survivor. BRCA status unknown. Sister BRCA negative.   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  ROS:  A ROS was performed and pertinent positives and negatives are included.  Exam:  Vitals:   12/01/19 1532  BP: 126/80  Weight: 107 lb (48.5 kg)  Height: '5\' 4"'$  (1.626 m)   Body mass index is 18.37 kg/m.  General appearance:  Normal Thyroid:  Symmetrical, normal in size, without palpable masses or nodularity. Respiratory  Auscultation:  Clear without wheezing or rhonchi Cardiovascular  Auscultation:  Regular rate, without rubs, murmurs or gallops  Edema/varicosities:  Not grossly evident Abdominal  Soft,nontender, without masses, guarding or rebound.  Liver/spleen:  No organomegaly noted  Hernia:  None appreciated  Skin  Inspection:  Grossly normal   Breasts: Examined lying and sitting. Bilateral implants  Right: Without masses, retractions, discharge or axillary adenopathy.   Left: Without masses, retractions, discharge or axillary adenopathy. Gentitourinary   Inguinal/mons:  Normal without inguinal adenopathy  External genitalia:  Normal  BUS/Urethra/Skene's glands:  Normal  Vagina:  Normal  Cervix:  Normal  Uterus:  normal in size, shape and contour.  Midline and mobile  Adnexa/parametria:     Rt: Without masses or  tenderness.   Lt: Without masses or tenderness.  Anus and perineum: Normal  Digital rectal exam: Normal sphincter tone without palpated masses or tenderness  Assessment/Plan:  49 y.o. separated WF G2 P2 for annual exam with no complaints of vaginal discharge, urinary symptoms, or abdominal pain. Increased fatigue.  Postmenopausal/no HRT/no bleeding x2 years Situational stress-impending divorce STD screen  Plan: Counseling,  self-care, leisure activities encouraged. SBEs reviewed importance of annual screening mammogram breast center information given instructed to schedule ASAP. BRCA testing discussed declines testing at this time. Healthy lifestyle, vitamin D 2000 IUs daily encouraged. CBC, CMP, HIV, RPR, TSH, Pap with HR HPV typing, GC/chlamydia.     Huel Cote [facility], 4:51 PM 12/01/2019

## 2019-12-01 NOTE — Patient Instructions (Addendum)
Breast center [phone] Good luck!!!  Health Maintenance, Female Adopting a healthy lifestyle and getting preventive care are important in promoting health and wellness. Ask your health care provider about:  The right schedule for you to have regular tests and exams.  Things you can do on your own to prevent diseases and keep yourself healthy. What should I know about diet, weight, and exercise? Eat a healthy diet   Eat a diet that includes plenty of vegetables, fruits, low-fat dairy products, and lean protein.  Do not eat a lot of foods that are high in solid fats, added sugars, or sodium. Maintain a healthy weight Body mass index (BMI) is used to identify weight problems. It estimates body fat based on height and weight. Your health care provider can help determine your BMI and help you achieve or maintain a healthy weight. Get regular exercise Get regular exercise. This is one of the most important things you can do for your health. Most adults should:  Exercise for at least 150 minutes each week. The exercise should increase your heart rate and make you sweat (moderate-intensity exercise).  Do strengthening exercises at least twice a week. This is in addition to the moderate-intensity exercise.  Spend less time sitting. Even light physical activity can be beneficial. Watch cholesterol and blood lipids Have your blood tested for lipids and cholesterol at 49 years of age, then have this test every 5 years. Have your cholesterol levels checked more often if:  Your lipid or cholesterol levels are high.  You are older than 49 years of age.  You are at high risk for heart disease. What should I know about cancer screening? Depending on your health history and family history, you may need to have cancer screening at various ages. This may include screening for:  Breast cancer.  Cervical cancer.  Colorectal cancer.  Skin cancer.  Lung cancer. What should I know about heart  disease, diabetes, and high blood pressure? Blood pressure and heart disease  High blood pressure causes heart disease and increases the risk of stroke. This is more likely to develop in people who have high blood pressure readings, are of African descent, or are overweight.  Have your blood pressure checked: ? Every 3-5 years if you are 54-40 years of age. ? Every year if you are 66 years old or older. Diabetes Have regular diabetes screenings. This checks your fasting blood sugar level. Have the screening done:  Once every three years after age 36 if you are at a normal weight and have a low risk for diabetes.  More often and at a younger age if you are overweight or have a high risk for diabetes. What should I know about preventing infection? Hepatitis B If you have a higher risk for hepatitis B, you should be screened for this virus. Talk with your health care provider to find out if you are at risk for hepatitis B infection. Hepatitis C Testing is recommended for:  Everyone born from [birth year] through 1965.  Anyone with known risk factors for hepatitis C. Sexually transmitted infections (STIs)  Get screened for STIs, including gonorrhea and chlamydia, if: ? You are sexually active and are younger than 49 years of age. ? You are older than 49 years of age and your health care provider tells you that you are at risk for this type of infection. ? Your sexual activity has changed since you were last screened, and you are at increased risk for chlamydia or gonorrhea.  Ask your health care provider if you are at risk.  Ask your health care provider about whether you are at high risk for HIV. Your health care provider may recommend a prescription medicine to help prevent HIV infection. If you choose to take medicine to prevent HIV, you should first get tested for HIV. You should then be tested every 3 months for as long as you are taking the medicine. Pregnancy  If you are about to stop  having your period (premenopausal) and you may become pregnant, seek counseling before you get pregnant.  Take 400 to 800 micrograms (mcg) of folic acid every day if you become pregnant.  Ask for birth control (contraception) if you want to prevent pregnancy. Osteoporosis and menopause Osteoporosis is a disease in which the bones lose minerals and strength with aging. This can result in bone fractures. If you are 65 years old or older, or if you are at risk for osteoporosis and fractures, ask your health care provider if you should:  Be screened for bone loss.  Take a calcium or vitamin D supplement to lower your risk of fractures.  Be given hormone replacement therapy (HRT) to treat symptoms of menopause. Follow these instructions at home: Lifestyle  Do not use any products that contain nicotine or tobacco, such as cigarettes, e-cigarettes, and chewing tobacco. If you need help quitting, ask your health care provider.  Do not use street drugs.  Do not share needles.  Ask your health care provider for help if you need support or information about quitting drugs. Alcohol use  Do not drink alcohol if: ? Your health care provider tells you not to drink. ? You are pregnant, may be pregnant, or are planning to become pregnant.  If you drink alcohol: ? Limit how much you use to 0-1 drink a day. ? Limit intake if you are breastfeeding.  Be aware of how much alcohol is in your drink. In the [place] one drink equals one 12 oz bottle of beer (355 mL), one 5 oz glass of wine (148 mL), or one 1 oz glass of hard liquor (44 mL). General instructions  Schedule regular health, dental, and eye exams.  Stay current with your vaccines.  Tell your health care provider if: ? You often feel depressed. ? You have ever been abused or do not feel safe at home. Summary  Adopting a healthy lifestyle and getting preventive care are important in promoting health and wellness.  Follow your health  care provider's instructions about healthy diet, exercising, and getting tested or screened for diseases.  Follow your health care provider's instructions on monitoring your cholesterol and blood pressure. This information is not intended to replace advice given to you by your health care provider. Make sure you discuss any questions you have with your health care provider. Document Revised: 07/23/2018 Document Reviewed: 07/23/2018 Elsevier Patient Education  2020 [employer].

## 2019-12-02 LAB — CBC WITH DIFFERENTIAL/PLATELET
Absolute Monocytes: 317 cells/uL (ref 200–950)
Basophils Absolute: 48 cells/uL (ref 0–200)
Basophils Relative: 1 %
Eosinophils Absolute: 91 cells/uL (ref 15–500)
Eosinophils Relative: 1.9 %
HCT: 38.2 % (ref 35.0–45.0)
Hemoglobin: 12.6 g/dL (ref 11.7–15.5)
Lymphs Abs: 2030 cells/uL (ref 850–3900)
MCH: 31.1 pg (ref 27.0–33.0)
MCHC: 33 g/dL (ref 32.0–36.0)
MCV: 94.3 fL (ref 80.0–100.0)
MPV: 10.8 fL (ref 7.5–12.5)
Monocytes Relative: 6.6 %
Neutro Abs: 2314 cells/uL (ref 1500–7800)
Neutrophils Relative %: 48.2 %
Platelets: 212 10*3/uL (ref 140–400)
RBC: 4.05 10*6/uL (ref 3.80–5.10)
RDW: 12.1 % (ref 11.0–15.0)
Total Lymphocyte: 42.3 %
WBC: 4.8 10*3/uL (ref 3.8–10.8)

## 2019-12-02 LAB — COMPREHENSIVE METABOLIC PANEL
AG Ratio: 1.8 (calc) (ref 1.0–2.5)
ALT: 20 U/L (ref 6–29)
AST: 23 U/L (ref 10–35)
Albumin: 4.7 g/dL (ref 3.6–5.1)
Alkaline phosphatase (APISO): 38 U/L (ref 31–125)
BUN: 17 mg/dL (ref 7–25)
CO2: 27 mmol/L (ref 20–32)
Calcium: 9.8 mg/dL (ref 8.6–10.2)
Chloride: 103 mmol/L (ref 98–110)
Creat: 0.84 mg/dL (ref 0.50–1.10)
Globulin: 2.6 g/dL (calc) (ref 1.9–3.7)
Glucose, Bld: 99 mg/dL (ref 65–99)
Potassium: 3.9 mmol/L (ref 3.5–5.3)
Sodium: 139 mmol/L (ref 135–146)
Total Bilirubin: 0.7 mg/dL (ref 0.2–1.2)
Total Protein: 7.3 g/dL (ref 6.1–8.1)

## 2019-12-02 LAB — TSH: TSH: 2.6 mIU/L

## 2019-12-02 LAB — HIV ANTIBODY (ROUTINE TESTING W REFLEX): HIV 1&2 Ab, 4th Generation: NONREACTIVE

## 2019-12-02 LAB — RPR: RPR Ser Ql: NONREACTIVE

## 2019-12-02 NOTE — Addendum Note (Signed)
Addended by: Lorine Bears on: 12/02/2019 08:26 AM   Modules accepted: Orders

## 2019-12-04 LAB — PAP IG, CT-NG NAA, HPV HIGH-RISK
C. trachomatis RNA, TMA: NOT DETECTED
HPV DNA High Risk: NOT DETECTED
N. gonorrhoeae RNA, TMA: NOT DETECTED

## 2020-06-03 DIAGNOSIS — D039 Melanoma in situ, unspecified: Secondary | ICD-10-CM | POA: Insufficient documentation

## 2020-06-06 ENCOUNTER — Other Ambulatory Visit: Payer: Self-pay | Admitting: Obstetrics & Gynecology

## 2020-06-06 ENCOUNTER — Ambulatory Visit
Admission: RE | Admit: 2020-06-06 | Discharge: 2020-06-06 | Disposition: A | Discharge status: Home / Self Care | Payer: BC Managed Care – PPO | Source: Ambulatory Visit | Attending: Obstetrics & Gynecology | Admitting: Obstetrics & Gynecology

## 2020-06-06 ENCOUNTER — Other Ambulatory Visit: Payer: Self-pay

## 2020-06-06 DIAGNOSIS — Z1231 Encounter for screening mammogram for malignant neoplasm of breast: Secondary | ICD-10-CM

## 2020-12-05 ENCOUNTER — Encounter: Payer: BC Managed Care – PPO | Admitting: Nurse Practitioner

## 2021-01-23 ENCOUNTER — Ambulatory Visit: Payer: BC Managed Care – PPO | Admitting: Nurse Practitioner

## 2021-01-31 ENCOUNTER — Telehealth: Payer: Self-pay | Admitting: Medical

## 2021-01-31 ENCOUNTER — Ambulatory Visit: Payer: Self-pay

## 2021-01-31 NOTE — Telephone Encounter (Signed)
Pt is on for a virtual tomorrow with Audelia Acton and has some questions about symptoms she is having and would like to speak with a nurse to see if she just go to an urgent care or ER she can be reached at [phone]

## 2021-01-31 NOTE — Telephone Encounter (Signed)
Spoke to patient and she stated she was at an urgent care but she wasn't going to wait because it would take too long to be seen. Hospital would take too long. Stated she was coughing and having pain on the left side of chest but would wait for her appointment tomorrow. Patient was advised to go to ed if she starts having sob.

## 2021-02-01 ENCOUNTER — Other Ambulatory Visit: Payer: Self-pay

## 2021-02-01 ENCOUNTER — Telehealth: Payer: BC Managed Care – PPO | Admitting: Family Medicine

## 2021-02-01 ENCOUNTER — Encounter: Payer: Self-pay | Admitting: Family Medicine

## 2021-02-01 VITALS — Ht 64.0 in | Wt 109.0 lb

## 2021-02-01 DIAGNOSIS — J0100 Acute maxillary sinusitis, unspecified: Secondary | ICD-10-CM

## 2021-02-01 DIAGNOSIS — R059 Cough, unspecified: Secondary | ICD-10-CM

## 2021-02-01 MED ORDER — BENZONATATE 100 MG PO CAPS: 200.0000 mg | ORAL_CAPSULE | Freq: Three times a day (TID) | ORAL | 0 refills | Status: DC | PRN

## 2021-02-01 MED ORDER — AMOXICILLIN-POT CLAVULANATE 875-125 MG PO TABS: 1.0000 | ORAL_TABLET | Freq: Two times a day (BID) | ORAL | 0 refills | Status: DC

## 2021-02-01 NOTE — Progress Notes (Signed)
   Subjective:    Patient ID: Diane Benjamin, female    DOB: July 06, 1971, 50 y.o.   MRN: [id number]  HPI Documentation for virtual audio and video telecommunications through [place] encounter: The patient was located at home. 2 patient identifiers used.  The provider was located in the office. The patient did consent to this visit and is aware of possible charges through their insurance for this visit. The other persons participating in this telemedicine service were none. Time spent on call was 5 minutes and in review of previous records >20 minutes total for counseling and coordination of care. This virtual service is not related to other E/M service within previous 7 days.  She states that last Wednesday she had difficulty with sore throat and was concerned about strep so she went to an urgent care center.  They treated her with tetracycline for a sinus infection.  She apparently did do some COVID test before that which were negative.  She states that she is still having difficulty with nasal congestion as well as a cough that is causing some rib discomfort, nasal congestion and some upper tooth discomfort but no earache, sore throat fever or chills.  She has been using Robitussin-DM.  Review of Systems     Objective:   Physical Exam Alert and in no distress.  Voice does sound rather nasal.       Assessment & Plan:  Acute non-recurrent maxillary sinusitis - Plan: amoxicillin-clavulanate (AUGMENTIN) 875-125 MG tablet  Cough - Plan: benzonatate (TESSALON) 100 MG capsule I explained that I thought she was not over the sinus infection.  I will give her Augmentin as well as Tessalon and also recommend 2 Aleve twice per day for the pain.  She will call in 10 days if not back to normal.  She was comfortable with that.

## 2021-02-14 ENCOUNTER — Telehealth: Payer: Self-pay | Admitting: Medical

## 2021-02-14 ENCOUNTER — Other Ambulatory Visit: Payer: Self-pay

## 2021-02-14 ENCOUNTER — Ambulatory Visit
Admission: RE | Admit: 2021-02-14 | Discharge: 2021-02-14 | Disposition: A | Discharge status: Home / Self Care | Payer: BLUE CROSS/BLUE SHIELD | Source: Ambulatory Visit | Attending: Family Medicine | Admitting: Family Medicine

## 2021-02-14 DIAGNOSIS — R059 Cough, unspecified: Secondary | ICD-10-CM

## 2021-02-14 NOTE — Telephone Encounter (Signed)
Pt was advised [facility] 

## 2021-02-14 NOTE — Telephone Encounter (Signed)
Pt called and said since her virtual visit with you she took the antibiotics and is still having congestion. She said it hurts under her breast when she breaths and would like to get an X-ray done.

## 2021-02-15 ENCOUNTER — Ambulatory Visit: Payer: BC Managed Care – PPO | Admitting: Medical

## 2021-02-15 VITALS — BP 120/70 | HR 80 | Temp 97.2°F | Resp 16 | Wt 109.0 lb

## 2021-02-15 DIAGNOSIS — H68012 Acute Eustachian salpingitis, left ear: Secondary | ICD-10-CM | POA: Insufficient documentation

## 2021-02-15 DIAGNOSIS — R0789 Other chest pain: Secondary | ICD-10-CM | POA: Diagnosis not present

## 2021-02-15 DIAGNOSIS — R059 Cough, unspecified: Secondary | ICD-10-CM | POA: Insufficient documentation

## 2021-02-15 DIAGNOSIS — Z85828 Personal history of other malignant neoplasm of skin: Secondary | ICD-10-CM | POA: Insufficient documentation

## 2021-02-15 DIAGNOSIS — J321 Chronic frontal sinusitis: Secondary | ICD-10-CM | POA: Diagnosis not present

## 2021-02-15 MED ORDER — LEVOFLOXACIN 500 MG PO TABS: 500.0000 mg | ORAL_TABLET | Freq: Every day | ORAL | 0 refills | Status: AC

## 2021-02-15 MED ORDER — HYDROCODONE BIT-HOMATROP MBR 5-1.5 MG/5ML PO SOLN: 5.0000 mL | Freq: Three times a day (TID) | ORAL | 0 refills | Status: AC | PRN

## 2021-02-15 MED ORDER — PREDNISONE 20 MG PO TABS: ORAL_TABLET | ORAL | 0 refills | Status: DC

## 2021-02-15 NOTE — Progress Notes (Signed)
Subjective:  Diane Benjamin is a 50 y.o. female who presents for Chief Complaint  Patient presents with   still sick    Still sick- been going on since beginning of June. Was on antibiotics , no relief. Having pain on left side under breast area. [place] team: Elon Alas, NP, gynecology Dermatology  Here for ongoing illness.  She was seen by urgent care June 15 for really bad sore throat followed by some sinus congestion.  She was given doxycycline which did not help.  She ended up seeing Dr. Redmond School here 02/01/21 virtual visit for same.   She did a round of Augmentin and Tessalon Perles and still did not seem to have a lot of improvement.  Symptoms began around 1 June with hoarse voice but then 2 weeks later horrible sore throat.  Currently her symptoms are head congestion, sinus pressure, lots of cough, stuffiness, just not seeming to get over the symptoms.  She is also developed some pain in her left lower rib that she attributes to coughing so much.  No injury no trauma.  Of note she sees dermatology has a history of melanoma.  She just had a biopsy of a right facial lesion this past week.  She does regular self breast exams no recent lumps or changes.  Although she has discomfort in her left lower chest wall no discomfort in her breast  No other aggravating or relieving factors.    No other c/o.  Past Medical History:  Diagnosis Date   Basal cell carcinoma    No current outpatient medications on file prior to visit.   No current facility-administered medications on file prior to visit.     The following portions of the patient's history were reviewed and updated as appropriate: allergies, current medications, past family history, past medical history, past social history, past surgical history and problem list.  ROS Otherwise as in subjective above  Objective: BP 120/70   Pulse 80   Temp (!) 97.2 F (36.2 C)   Resp 16   Wt 109 lb (49.4 kg)   LMP  11/30/2017 (LMP Unknown)   SpO2 98%   BMI 18.71 kg/m   General appearance: alert, no distress, well developed, well nourished, congested sounding HEENT: normocephalic, sclerae anicteric, conjunctiva pink and moist, TMs flat bilaterally, left with mucous thick behind the eardrum, otherwise, nares with turbinate edema, mild erythema and some mucoid discharge, pharynx normal Oral cavity: MMM, no lesions Neck: supple, no lymphadenopathy, no thyromegaly, no masses Heart: RRR, normal S1, S2, no murmurs Lungs: CTA bilaterally, no wheezes, rhonchi, or rales Abdomen: +bs, soft, non tender, non distended, no masses, no hepatomegaly, no splenomegaly Tender over left lower chest wall anteriorly otherwise nontender No obvious lymphadenopathy Pulses: 2+ radial pulses, 2+ pedal pulses, normal cap refill Ext: no edema  Patient declines breast exam today    Assessment: Encounter Diagnoses  Name Primary?   Eustachian salpingitis, acute, left Yes   Chronic frontal sinusitis    Chest wall pain    History of skin cancer    Cough      Plan: We discussed symptoms and concerns.  I reviewed her urgent care notes and the notes from our office here on June 22.  We will go 1 more round of treatment as below including prednisone.  Continue to hydrate well.  Consider nasal saline flush.  If not completely resolved within 10 to 14 days then consider labs for CBC as well  as CT of sinuses a referral to ENT  Chest wall pain-currently thought to be attributed to lots of coughing, costochondritis or inflammation from all the coughing.  We discussed potentially other evaluation if this does not resolve within a short period.  If not 100% back to normal within 10 to 14 days , then will need additional evaluation of chest wall discomfort and sinuses  Icie was seen today for still sick.  Diagnoses and all orders for this visit:  Eustachian salpingitis, acute, left  Chronic frontal sinusitis  Chest wall  pain  History of skin cancer  Cough  Other orders -     predniSONE (DELTASONE) 20 MG tablet; 3 tablets daily for 3 days, then 2 tablets daily for 3 days, then 1 tablet daily for 3 days, then 1/2 tablet daily for 3 days. -     HYDROcodone bit-homatropine (HYCODAN) 5-1.5 MG/5ML syrup; Take 5 mLs by mouth every 8 (eight) hours as needed for up to 5 days for cough. -     levofloxacin (LEVAQUIN) 500 MG tablet; Take 1 tablet (500 mg total) by mouth daily for 7 days.   Follow up: pending call back

## 2021-02-21 ENCOUNTER — Other Ambulatory Visit: Payer: Self-pay | Admitting: Medical

## 2021-02-21 DIAGNOSIS — R5383 Other fatigue: Secondary | ICD-10-CM

## 2021-02-21 DIAGNOSIS — J321 Chronic frontal sinusitis: Secondary | ICD-10-CM

## 2021-02-22 ENCOUNTER — Other Ambulatory Visit: Payer: Self-pay | Admitting: Medical

## 2021-02-22 DIAGNOSIS — G4489 Other headache syndrome: Secondary | ICD-10-CM

## 2021-02-22 DIAGNOSIS — J321 Chronic frontal sinusitis: Secondary | ICD-10-CM

## 2021-02-22 DIAGNOSIS — R5383 Other fatigue: Secondary | ICD-10-CM

## 2021-02-22 DIAGNOSIS — R42 Dizziness and giddiness: Secondary | ICD-10-CM

## 2021-03-03 ENCOUNTER — Ambulatory Visit
Admission: RE | Admit: 2021-03-03 | Discharge: 2021-03-03 | Disposition: A | Discharge status: Home / Self Care | Payer: BLUE CROSS/BLUE SHIELD | Source: Ambulatory Visit | Attending: Medical | Admitting: Medical

## 2021-03-03 DIAGNOSIS — J321 Chronic frontal sinusitis: Secondary | ICD-10-CM

## 2021-03-03 DIAGNOSIS — R42 Dizziness and giddiness: Secondary | ICD-10-CM

## 2021-03-03 DIAGNOSIS — G4489 Other headache syndrome: Secondary | ICD-10-CM

## 2021-03-03 DIAGNOSIS — R5383 Other fatigue: Secondary | ICD-10-CM

## 2021-03-06 ENCOUNTER — Telehealth: Payer: Self-pay | Admitting: Medical

## 2021-03-06 ENCOUNTER — Other Ambulatory Visit: Payer: Self-pay | Admitting: Medical

## 2021-03-06 DIAGNOSIS — R42 Dizziness and giddiness: Secondary | ICD-10-CM

## 2021-03-06 DIAGNOSIS — J321 Chronic frontal sinusitis: Secondary | ICD-10-CM

## 2021-03-06 DIAGNOSIS — G4489 Other headache syndrome: Secondary | ICD-10-CM

## 2021-03-06 MED ORDER — PREDNISONE 10 MG PO TABS
ORAL_TABLET | ORAL | 0 refills | Status: DC
Start: 2021-03-06 — End: 2021-04-19

## 2021-03-06 MED ORDER — DOXYCYCLINE HYCLATE 100 MG PO TABS: 100.0000 mg | ORAL_TABLET | Freq: Two times a day (BID) | ORAL | 0 refills | Status: DC

## 2021-03-06 NOTE — Telephone Encounter (Signed)
Got pt scheduled 

## 2021-03-06 NOTE — Telephone Encounter (Signed)
Pt called and said she was told she needed labs but there are no orders in the system

## 2021-03-09 ENCOUNTER — Ambulatory Visit: Payer: BC Managed Care – PPO | Admitting: Medical

## 2021-03-30 ENCOUNTER — Telehealth: Payer: Self-pay | Admitting: Medical

## 2021-03-30 ENCOUNTER — Ambulatory Visit: Payer: BC Managed Care – PPO

## 2021-03-30 NOTE — Telephone Encounter (Signed)
Beth from [facility] called and said pt was seen by them but her main concern was rib pain not just the cough. Beth said they will treat her for the cough with Tessalon pearls but she may need a xray for her rib pain. She stated that it is fine to call the pt to see what she wants to do.

## 2021-04-19 ENCOUNTER — Encounter: Payer: Self-pay | Admitting: Otolaryngology

## 2021-04-25 ENCOUNTER — Ambulatory Visit: Payer: BC Managed Care – PPO | Admitting: Anesthesiology

## 2021-04-25 ENCOUNTER — Other Ambulatory Visit: Payer: Self-pay

## 2021-04-25 ENCOUNTER — Ambulatory Visit
Admission: RE | Admit: 2021-04-25 | Discharge: 2021-04-25 | Disposition: A | Discharge status: Home / Self Care | Payer: BC Managed Care – PPO | Attending: Otolaryngology | Admitting: Otolaryngology

## 2021-04-25 ENCOUNTER — Encounter: Payer: Self-pay | Admitting: Otolaryngology

## 2021-04-25 ENCOUNTER — Encounter
Admission: RE | Disposition: A | Discharge status: Home / Self Care | Payer: Self-pay | Source: Home / Self Care | Attending: Otolaryngology

## 2021-04-25 DIAGNOSIS — J324 Chronic pansinusitis: Secondary | ICD-10-CM | POA: Insufficient documentation

## 2021-04-25 DIAGNOSIS — Z79899 Other long term (current) drug therapy: Secondary | ICD-10-CM | POA: Insufficient documentation

## 2021-04-25 DIAGNOSIS — Z881 Allergy status to other antibiotic agents status: Secondary | ICD-10-CM | POA: Diagnosis not present

## 2021-04-25 DIAGNOSIS — Z820 Family history of epilepsy and other diseases of the nervous system: Secondary | ICD-10-CM | POA: Diagnosis not present

## 2021-04-25 DIAGNOSIS — J338 Other polyp of sinus: Secondary | ICD-10-CM | POA: Diagnosis not present

## 2021-04-25 DIAGNOSIS — Z8349 Family history of other endocrine, nutritional and metabolic diseases: Secondary | ICD-10-CM | POA: Insufficient documentation

## 2021-04-25 HISTORY — PX: IMAGE GUIDED SINUS SURGERY: SHX6570

## 2021-04-25 HISTORY — PX: MAXILLARY ANTROSTOMY: SHX2003

## 2021-04-25 HISTORY — PX: ETHMOIDECTOMY: SHX5197

## 2021-04-25 SURGERY — SINUS SURGERY, WITH IMAGING GUIDANCE
Anesthesia: General | Site: Nose

## 2021-04-25 MED ORDER — LACTATED RINGERS IV SOLN: INTRAVENOUS | Status: DC

## 2021-04-25 MED ORDER — FENTANYL CITRATE PF 50 MCG/ML IJ SOSY: 25.0000 ug | PREFILLED_SYRINGE | INTRAMUSCULAR | Status: DC | PRN

## 2021-04-25 MED ORDER — MIDAZOLAM HCL 5 MG/5ML IJ SOLN
INTRAMUSCULAR | Status: DC | PRN
  Administered 2021-04-25: 2 mg via INTRAVENOUS

## 2021-04-25 MED ORDER — LIDOCAINE-EPINEPHRINE 1 %-1:100000 IJ SOLN
INTRAMUSCULAR | Status: DC | PRN
  Administered 2021-04-25: 2 mL
  Administered 2021-04-25: 9 mL

## 2021-04-25 MED ORDER — OXYCODONE HCL 5 MG/5ML PO SOLN
5.0000 mg | Freq: Once | ORAL | Status: AC | PRN
  Administered 2021-04-25: 5 mg via ORAL

## 2021-04-25 MED ORDER — PREDNISONE 10 MG (21) PO TBPK: ORAL_TABLET | ORAL | 0 refills | Status: DC

## 2021-04-25 MED ORDER — HYDROCODONE-ACETAMINOPHEN 5-325 MG PO TABS: 1.0000 | ORAL_TABLET | Freq: Four times a day (QID) | ORAL | 0 refills | Status: DC | PRN

## 2021-04-25 MED ORDER — FENTANYL CITRATE PF 50 MCG/ML IJ SOSY
25.0000 ug | PREFILLED_SYRINGE | INTRAMUSCULAR | Status: DC | PRN
  Administered 2021-04-25 (×2): 25 ug via INTRAVENOUS

## 2021-04-25 MED ORDER — ONDANSETRON HCL 4 MG/2ML IJ SOLN
4.0000 mg | Freq: Once | INTRAMUSCULAR | Status: AC | PRN
  Administered 2021-04-25: 4 mg via INTRAVENOUS

## 2021-04-25 MED ORDER — DEXAMETHASONE SODIUM PHOSPHATE 4 MG/ML IJ SOLN
INTRAMUSCULAR | Status: DC | PRN
  Administered 2021-04-25: 10 mg via INTRAVENOUS

## 2021-04-25 MED ORDER — ONDANSETRON HCL 4 MG/2ML IJ SOLN
INTRAMUSCULAR | Status: DC | PRN
  Administered 2021-04-25: 4 mg via INTRAVENOUS

## 2021-04-25 MED ORDER — OXYMETAZOLINE HCL 0.05 % NA SOLN
NASAL | Status: DC | PRN
  Administered 2021-04-25: 1 via TOPICAL

## 2021-04-25 MED ORDER — LEVOFLOXACIN 500 MG PO TABS: 500.0000 mg | ORAL_TABLET | Freq: Every day | ORAL | 0 refills | Status: AC

## 2021-04-25 MED ORDER — OXYCODONE HCL 5 MG PO TABS: 5.0000 mg | ORAL_TABLET | Freq: Once | ORAL | Status: AC | PRN

## 2021-04-25 MED ORDER — SCOPOLAMINE 1 MG/3DAYS TD PT72
1.0000 | MEDICATED_PATCH | TRANSDERMAL | Status: DC
  Administered 2021-04-25: 1.5 mg via TRANSDERMAL

## 2021-04-25 MED ORDER — OXYCODONE HCL 5 MG PO TABS
5.0000 mg | ORAL_TABLET | Freq: Once | ORAL | Status: AC | PRN
Start: 2021-04-25 — End: 2021-04-25

## 2021-04-25 MED ORDER — SUCCINYLCHOLINE CHLORIDE 200 MG/10ML IV SOSY
PREFILLED_SYRINGE | INTRAVENOUS | Status: DC | PRN
  Administered 2021-04-25: 80 mg via INTRAVENOUS

## 2021-04-25 MED ORDER — FENTANYL CITRATE (PF) 100 MCG/2ML IJ SOLN
INTRAMUSCULAR | Status: DC | PRN
  Administered 2021-04-25 (×2): 50 ug via INTRAVENOUS
  Administered 2021-04-25: 25 ug via INTRAVENOUS

## 2021-04-25 MED ORDER — GLYCOPYRROLATE 0.2 MG/ML IJ SOLN
INTRAMUSCULAR | Status: DC | PRN
  Administered 2021-04-25: .1 mg via INTRAVENOUS

## 2021-04-25 MED ORDER — PROPOFOL 10 MG/ML IV BOLUS
INTRAVENOUS | Status: DC | PRN
  Administered 2021-04-25: 150 mg via INTRAVENOUS

## 2021-04-25 MED ORDER — PROMETHAZINE HCL 25 MG/ML IJ SOLN: 6.2500 mg | Freq: Once | INTRAMUSCULAR | Status: DC | PRN

## 2021-04-25 MED ORDER — ACETAMINOPHEN 10 MG/ML IV SOLN
1000.0000 mg | Freq: Once | INTRAVENOUS | Status: AC
  Administered 2021-04-25: 1000 mg via INTRAVENOUS

## 2021-04-25 MED ORDER — LIDOCAINE HCL (CARDIAC) PF 100 MG/5ML IV SOSY
PREFILLED_SYRINGE | INTRAVENOUS | Status: DC | PRN
  Administered 2021-04-25: 50 mg via INTRAVENOUS

## 2021-04-25 SURGICAL SUPPLY — 25 items
BATTERY INSTRU NAVIGATION (MISCELLANEOUS) ×9 IMPLANT
BTRY SRG DRVR LF (MISCELLANEOUS) ×6
CANISTER SUCT 1200ML W/VALVE (MISCELLANEOUS) ×3 IMPLANT
COAG SUCT 10F 3.5MM HAND CTRL (MISCELLANEOUS) ×3 IMPLANT
ELECT REM PT RETURN 9FT ADLT (ELECTROSURGICAL) ×3
ELECTRODE REM PT RTRN 9FT ADLT (ELECTROSURGICAL) ×2 IMPLANT
GLOVE SURG ENC MOIS LTX SZ7.5 (GLOVE) ×6 IMPLANT
GOWN STRL REUS W/ TWL LRG LVL3 (GOWN DISPOSABLE) ×2 IMPLANT
GOWN STRL REUS W/TWL LRG LVL3 (GOWN DISPOSABLE) ×3
IV NS 500ML (IV SOLUTION) ×3
IV NS 500ML BAXH (IV SOLUTION) ×2 IMPLANT
KIT TURNOVER KIT A (KITS) ×3 IMPLANT
NEEDLE ANESTHESIA  27G X 3.5 (NEEDLE) ×3
NEEDLE ANESTHESIA 27G X 3.5 (NEEDLE) ×2 IMPLANT
NS IRRIG 500ML POUR BTL (IV SOLUTION) ×3 IMPLANT
PACK ENT CUSTOM (PACKS) ×3 IMPLANT
PACKING NASAL EPIS 4X2.4 XEROG (MISCELLANEOUS) ×6 IMPLANT
PATTIES SURGICAL .5 X3 (DISPOSABLE) ×3 IMPLANT
SHAVER DIEGO BLD STD TYPE A (BLADE) ×3 IMPLANT
SOL ANTI-FOG 6CC FOG-OUT (MISCELLANEOUS) ×2 IMPLANT
SOL FOG-OUT ANTI-FOG 6CC (MISCELLANEOUS) ×1
SYR 10ML LL (SYRINGE) ×3 IMPLANT
TRACKER CRANIALMASK (MASK) ×3 IMPLANT
TUBING DECLOG MULTIDEBRIDER (TUBING) ×3 IMPLANT
WATER STERILE IRR 250ML POUR (IV SOLUTION) ×3 IMPLANT

## 2021-04-25 NOTE — H&P (Signed)
History and physical reviewed and will be scanned in later. No change in medical status reported by the patient or family, appears stable for surgery. All questions regarding the procedure answered, and patient (or family if a child) expressed understanding of the procedure. ? ?Diane Benjamin [name] ?@TODAY@ ?

## 2021-04-25 NOTE — Op Note (Signed)
04/25/2021  11:07 AM    Diane Benjamin  EE:[id number]   Pre-Op Diagnosis:  Chronic pansinusitis Post-op Diagnosis: Chronic pansinusitis  Procedure:  1)  Image Guided Sinus Surgery,   2)  Bilateral Endoscopic Maxillary Antrostomy with Tissue Removal   3)  Bilateral Frontal Sinusotomy   4)  Bilateral Total Ethmoidectomy   5)  Bilateral Sphenoidotomy with tissue removal    Surgeon:  Riley Nearing  Anesthesia:  General endotracheal  EBL:  50 cc  Complications:  None  Findings: Severe polypoid mucosal thickening in the maxillary and ethmoid sinuses as well as the frontal recess and sphenoid recess.  Cloudy secretions were cultured.  Procedure: After the patient was identified in holding and the benefits of the procedure were reviewed as well as the consent and risks, the patient was taken to the operating room and with the patient in a comfortable supine position,  general orotracheal anesthesia was induced without difficulty.  A proper time-out was performed.  The [employer] image guidance system was set up and calibrated in the normal fashion and felt to be acceptable.  Next 1% Xylocaine with 1:100,000 epinephrine was infiltrated into the inferior turbinates, septum, and anterior middle turbinates bilaterally.  Several minutes were allowed for this to take effect.  Cottoniod pledgets soaked in Afrin were placed into both nasal cavities and left while the patient was prepped and draped in the standard fashion. The image guided suction was calibrated and used to inspect known points in the nasal cavity to assess accuracy of the image guided system. Accuracy was felt to be excellent.  Additional local was injected into the posterior nasal cavity bilaterally along the face of the sphenoid and posterior middle turbinate.  The left middle turbinate was medialized and the uncinate process then resected with through-cutting forceps as well as the microdebrider. In this fashion the uncinate was  completely removed along with soft tissue and bone of the medial wall of the maxillary sinus to create a large patent maxillary antrostomy. The left maxillary sinus was suctioned to clear secretions.   Next the left anterior ethmoid sinuses were dissected beginning inferomedially, entering the ethmoid bulla. Thru cut forceps were used to open the anterior ethmoids. The microdebrider was used as needed to trim loose mucosal edges.  Next the basal lamella was entered and, working back in a sequential fashion through the ethmoid air cels, the ethmoid sinuses were dissected to the posterior ethmoid sinuses, utilizing the image guided suction and the whole time to reassess the anatomy frequently. Thru cutting forceps were used for this dissection. Care was taken to avoid injury to the lamina papyracea laterally and the skull base superiorly.   Dissection proceeded anteriorly and superiorly into the left frontal recess which was dissected utilizing a 30 and then a 70 scope and frontal curved instruments. The curved image guided suction was used during this dissection to frequently reassess the anatomy on the CT scan. The frontal recess was dissected until a suction could be passed up into the region of the frontal sinus.   Next the scope was passed medial to the middle turbinate and the left sphenoid recess inspected. With the assistance of the image guided system, the sphenoid sinus was carefully entered through some thin bone at the anterior face of the spenoid, medial to the superior turbinate, and widely opened, removing swollen mucosal tissue, with through-cutting sphenoid punch forceps. Mucus was suctioned from the sphenoid sinus.  Attention was then turned to the right side where  the same procedure was performed, opening the maxillary sinus, ethmoid cavities, sphenoid sinus and the frontal recess in the same fashion as described above.   The nose was suctioned and inspected. The maxillary and ethmoid  sinuses were irrigated with saline.  Xerogel absorbable sinus packing was then placed in the ethmoid cavities bilaterally.   The patient was then returned to the anesthesiologist for awakening and taken to recovery room in good condition postoperatively.  Disposition:   PACU and d/c home  Plan: Ice, elevation, narcotic analgesia and prophylactic antibiotics, as well as a Sterapred taper. Begin sinus irrigations with saline tomrrow, irrigating 3-4 times daily. Return to the office in 7 days.  Return to work in 7-10 days, no strenuous activities for two weeks.   Riley Nearing 04/25/2021 11:07 AM

## 2021-04-25 NOTE — Anesthesia Postprocedure Evaluation (Signed)
Anesthesia Post Note  Patient: Diane Benjamin  Procedure(s) Performed: IMAGE GUIDED SINUS SURGERY (Nose) ENDOSCOPICMAXILLARY ANTROSTOMY WITH TISSUE (Bilateral: Nose) TOTAL ETHMOIDECTOMY, FRONTAL RECESS EXPLORATION, SPHENOIDOTOMY (Bilateral: Nose)     Patient location during evaluation: PACU Anesthesia Type: General Level of consciousness: awake and alert Pain management: pain level controlled Vital Signs Assessment: post-procedure vital signs reviewed and stable Respiratory status: spontaneous breathing Cardiovascular status: stable Anesthetic complications: no   No notable events documented.  Gillian Scarce

## 2021-04-25 NOTE — Anesthesia Preprocedure Evaluation (Signed)
Anesthesia Evaluation  Patient identified by MRN, date of birth, ID band Patient awake    Reviewed: Allergy & Precautions, H&P , NPO status , Patient's Chart, lab work & pertinent test results  Airway Mallampati: I  TM Distance: >3 FB Neck ROM: full    Dental no notable dental hx.    Pulmonary neg pulmonary ROS,   Some chronic nasal congestion and drainage with her sinus disease. Pulmonary exam normal breath sounds clear to auscultation       Cardiovascular negative cardio ROS Normal cardiovascular exam Rhythm:regular Rate:Normal     Neuro/Psych negative neurological ROS     GI/Hepatic negative GI ROS, Neg liver ROS,   Endo/Other  negative endocrine ROS  Renal/GU negative Renal ROS  negative genitourinary   Musculoskeletal   Abdominal   Peds  Hematology negative hematology ROS (+)   Anesthesia Other Findings   Reproductive/Obstetrics                             Anesthesia Physical Anesthesia Plan  ASA: 1  Anesthesia Plan: General ETT   Post-op Pain Management:    Induction:   PONV Risk Score and Plan: 3 and Ondansetron, Dexamethasone, Midazolam, Scopolamine patch - Pre-op and Treatment may vary due to age or medical condition  Airway Management Planned:   Additional Equipment:   Intra-op Plan:   Post-operative Plan:   Informed Consent: I have reviewed the patients History and Physical, chart, labs and discussed the procedure including the risks, benefits and alternatives for the proposed anesthesia with the patient or authorized representative who has indicated his/her understanding and acceptance.       Plan Discussed with:   Anesthesia Plan Comments:         Anesthesia Quick Evaluation

## 2021-04-25 NOTE — Anesthesia Procedure Notes (Signed)
Procedure Name: Intubation Date/Time: 04/25/2021 8:21 AM Performed by: Mayme Genta, CRNA Pre-anesthesia Checklist: Patient identified, Emergency Drugs available, Suction available, Patient being monitored and Timeout performed Patient Re-evaluated:Patient Re-evaluated prior to induction Oxygen Delivery Method: Circle system utilized Preoxygenation: Pre-oxygenation with 100% oxygen Induction Type: IV induction Ventilation: Mask ventilation without difficulty Laryngoscope Size: Miller and 2 Grade View: Grade I Tube type: Oral Rae Tube size: 7.0 mm Number of attempts: 1 Placement Confirmation: ETT inserted through vocal cords under direct vision, positive ETCO2 and breath sounds checked- equal and bilateral Tube secured with: Tape Dental Injury: Teeth and Oropharynx as per pre-operative assessment

## 2021-04-25 NOTE — Discharge Instructions (Signed)
[facility] ENDOSCOPIC SINUS SURGERY [facility]  What is Functional Endoscopic Sinus Surgery?  The Surgery involves making the natural openings of the sinuses larger by removing the bony partitions that separate the sinuses from the nasal cavity.  The natural sinus lining is preserved as much as possible to allow the sinuses to resume normal function after the surgery.  In some patients nasal polyps (excessively swollen lining of the sinuses) may be removed to relieve obstruction of the sinus openings.  The surgery is performed through the nose using lighted scopes, which eliminates the need for incisions on the face.  A septoplasty is a different procedure which is sometimes performed with sinus surgery.  It involves straightening the boy partition that separates the two sides of your nose.  A crooked or deviated septum may need repair if is obstructing the sinuses or nasal airflow.  Turbinate reduction is also often performed during sinus surgery.  The turbinates are bony proturberances from the side walls of the nose which swell and can obstruct the nose in patients with sinus and allergy problems.  Their size can be surgically reduced to help relieve nasal obstruction.  What Can Sinus Surgery Do For Me?  Sinus surgery can reduce the frequency of sinus infections requiring antibiotic treatment.  This can provide improvement in nasal congestion, post-nasal drainage, facial pressure and nasal obstruction.  Surgery will NOT prevent you from ever having an infection again, so it usually only for patients who get infections 4 or more times yearly requiring antibiotics, or for infections that do not clear with antibiotics.  It will not cure nasal allergies, so patients with allergies may still require medication to treat their allergies after surgery. Surgery may improve headaches related to sinusitis, however, some people will continue to  require medication to control sinus headaches related to allergies.  Surgery will do nothing for other forms of headache (migraine, tension or cluster).  What Are the Risks of Endoscopic Sinus Surgery?  Current techniques allow surgery to be performed safely with little risk, however, there are rare complications that patients should be aware of.  Because the sinuses are located around the eyes, there is risk of eye injury, including blindness, though again, this would be quite rare. This is usually a result of bleeding behind the eye during surgery, which can effect vision, though there are treatments to protect the vision and prevent permanent injury. More serious complications would include bleeding inside the brain cavity or damage to the brain.This happens when the fluid around the brain leaks out into the sinus cavity.  Again, all of these complications are uncommon, and spinal fluid leaks can be safely managed surgically if they occur.  The most common complication of sinus surgery is bleeding from the nose, which may require packing or cauterization of the nose.  Patients with polyps may experience recurrence of the polyps that would require revision surgery.  Alterations of sense of smell or injury to the tear ducts are also rare complications.   What is the Surgery Like, and what is the Recovery?  The Surgery usually takes a couple of hours to perform, and is usually performed under a general anesthetic (completely asleep).  Patients are usually discharged home after a couple of hours.  Sometimes during surgery it is necessary to pack the nose to control bleeding, and the packing is left in place for 24 - 48 hours, and removed by your surgeon.  If   a septoplasty was performed during the procedure, there is often a splint placed which must be removed after 5-7 days.   Discomfort: Pain is usually mild to moderate, and can be controlled by prescription pain medication or acetaminophen (Tylenol).   Aspirin, Ibuprofen (Advil, Motrin), or Naprosyn (Aleve) should be avoided, as they can cause increased bleeding.  Most patients feel sinus pressure like they have a bad head cold for several days.  Sleeping with your head elevated can help reduce swelling and facial pressure, as can ice packs over the face.  A humidifier may be helpful to keep the mucous and blood from drying in the nose.   Diet: There are no specific diet restrictions, however, you should generally start with clear liquids and a light diet of bland foods because the anesthetic can cause some nausea.  Advance your diet depending on how your stomach feels.  Taking your pain medication with food will often help reduce stomach upset which pain medications can cause.  Nasal Saline Irrigation: It is important to remove blood clots and dried mucous from the nose as it is healing.  This is done by having you irrigate the nose at least 3 - 4 times daily with a salt water solution.  We recommend using NeilMed Sinus Rinse (available at the drug store).  Fill the squeeze bottle with the solution, bend over a sink, and insert the tip of the squeeze bottle into the nose  of an inch.  Point the tip of the squeeze bottle towards the inside corner of the eye on the same side your irrigating.  Squeeze the bottle and gently irrigate the nose.  If you bend forward as you do this, most of the fluid will flow back out of the nose, instead of down your throat.   The solution should be warm, near body temperature, when you irrigate.   Each time you irrigate, you should use a full squeeze bottle.   Note that if you are instructed to use Nasal Steroid Sprays at any time after your surgery, irrigate with saline BEFORE using the steroid spray, so you do not wash it all out of the nose. Another product, Nasal Saline Gel (such as AYR Nasal Saline Gel) can be applied in each nostril 3 - 4 times daily to moisture the nose and reduce scabbing or crusting.  Bleeding:   Bloody drainage from the nose can be expected for several days, and patients are instructed to irrigate their nose frequently with salt water to help remove mucous and blood clots.  The drainage may be dark red or brown, though some fresh blood may be seen intermittently, especially after irrigation.  Do not blow you nose, as bleeding may occur. If you must sneeze, keep your mouth open to allow air to escape through your mouth.  If heavy bleeding occurs: Irrigate the nose with saline to rinse out clots, then spray the nose 3 - 4 times with Afrin Nasal Decongestant Spray.  The spray will constrict the blood vessels to slow bleeding.  Pinch the lower half of your nose shut to apply pressure, and lay down with your head elevated.  Ice packs over the nose may help as well. If bleeding persists despite these measures, you should notify your doctor.  Do not use the Afrin routinely to control nasal congestion after surgery, as it can result in worsening congestion and may affect healing.     Activity: Return to work varies among patients. Most patients will be out   of work at least 5 - 7 days to recover.  Patient may return to work after they are off of narcotic pain medication, and feeling well enough to perform the functions of their job.  Patients must avoid heavy lifting (over 10 pounds) or strenuous physical for 2 weeks after surgery, so your employer may need to assign you to light duty, or keep you out of work longer if light duty is not possible.  NOTE: you should not drive, operate dangerous machinery, do any mentally demanding tasks or make any important legal or financial decisions while on narcotic pain medication and recovering from the general anesthetic.    Call Your Doctor Immediately if You Have Any of the Following: Bleeding that you cannot control with the above measures Loss of vision, double vision, bulging of the eye or black eyes. Fever over 101 degrees Neck stiffness with severe headache,  fever, nausea and change in mental state. You are always encouraged to call anytime with concerns, however, please call with requests for pain medication refills during office hours.  Office Endoscopy: During follow-up visits your doctor will remove any packing or splints that may have been placed and evaluate and clean your sinuses endoscopically.  Topical anesthetic will be used to make this as comfortable as possible, though you may want to take your pain medication prior to the visit.  How often this will need to be done varies from patient to patient.  After complete recovery from the surgery, you may need follow-up endoscopy from time to time, particularly if there is concern of recurrent infection or nasal polyps.  

## 2021-04-25 NOTE — Transfer of Care (Signed)
Immediate Anesthesia Transfer of Care Note  Patient: Diane Benjamin  Procedure(s) Performed: IMAGE GUIDED SINUS SURGERY (Nose) ENDOSCOPICMAXILLARY ANTROSTOMY WITH TISSUE (Bilateral: Nose) TOTAL ETHMOIDECTOMY, FRONTAL RECESS EXPLORATION, SPHENOIDOTOMY (Bilateral: Nose)  Patient Location: PACU  Anesthesia Type: General ETT  Level of Consciousness: awake, alert  and patient cooperative  Airway and Oxygen Therapy: Patient Spontanous Breathing and Patient connected to supplemental oxygen  Post-op Assessment: Post-op Vital signs reviewed, Patient's Cardiovascular Status Stable, Respiratory Function Stable, Patent Airway and No signs of Nausea or vomiting  Post-op Vital Signs: Reviewed and stable  Complications: No notable events documented.

## 2021-04-26 ENCOUNTER — Encounter: Payer: Self-pay | Admitting: Otolaryngology

## 2021-04-27 LAB — SURGICAL PATHOLOGY

## 2021-04-30 LAB — AEROBIC/ANAEROBIC CULTURE W GRAM STAIN (SURGICAL/DEEP WOUND): Culture: NORMAL

## 2021-08-10 ENCOUNTER — Ambulatory Visit
Admission: RE | Admit: 2021-08-10 | Discharge: 2021-08-10 | Disposition: A | Discharge status: Home / Self Care | Payer: Self-pay | Source: Ambulatory Visit | Attending: Otolaryngology | Admitting: Otolaryngology

## 2021-08-10 ENCOUNTER — Other Ambulatory Visit: Payer: Self-pay

## 2021-08-10 ENCOUNTER — Other Ambulatory Visit: Payer: Self-pay | Admitting: Otolaryngology

## 2021-08-10 DIAGNOSIS — R059 Cough, unspecified: Secondary | ICD-10-CM

## 2021-09-22 ENCOUNTER — Telehealth (INDEPENDENT_AMBULATORY_CARE_PROVIDER_SITE_OTHER): Payer: BC Managed Care – PPO | Admitting: Medical

## 2021-09-22 ENCOUNTER — Encounter: Payer: Self-pay | Admitting: Medical

## 2021-09-22 VITALS — Wt 109.0 lb

## 2021-09-22 DIAGNOSIS — H1031 Unspecified acute conjunctivitis, right eye: Secondary | ICD-10-CM | POA: Diagnosis not present

## 2021-09-22 DIAGNOSIS — Z20822 Contact with and (suspected) exposure to covid-19: Secondary | ICD-10-CM

## 2021-09-22 DIAGNOSIS — J029 Acute pharyngitis, unspecified: Secondary | ICD-10-CM

## 2021-09-22 MED ORDER — POLYMYXIN B-TRIMETHOPRIM 10000-0.1 UNIT/ML-% OP SOLN: 2.0000 [drp] | Freq: Four times a day (QID) | OPHTHALMIC | 0 refills | Status: DC

## 2021-09-22 NOTE — Progress Notes (Signed)
Note emailed to pt

## 2021-09-22 NOTE — Patient Instructions (Signed)
Covid precautions, possible covid infection  General recommendations: I recommend you rest, hydrate well with water and clear fluids throughout the day.   You can use Tylenol for pain or fever You can use over the counter Delsym for cough. You can use over the counter Emetrol for nausea.    You can use mucinex or sudafed or other sinus decongestant over the counter if needed  If you get worse red eye, purulent discharge from the eye, very goupy eye over the weekend then use the antibiotic eye drops sent to the pharmacy.   If you are having trouble breathing, if you are very weak, have high fever 103 or higher consistently despite Tylenol, or uncontrollable nausea and vomiting, then call or go to the emergency department.    If you have other questions or have other symptoms or questions you are concerned about then please make a virtual visit  Covid symptoms such as fatigue and cough can linger over 2 weeks, even after the initial fever, aches, chills, and other initial symptoms.   Self Quarantine: The CDC, Centers for Disease Control has recommended a self quarantine of 5 days from the start of your illness until you are symptom-free including at least 24 hours of no symptoms including no fever, no shortness of breath, and no body aches and chills, by day 5 before returning to work or general contact with the public.  What does self quarantine mean: avoiding contact with people as much as possible.   Particularly in your house, isolate your self from others in a separate room, wear a mask when possible in the room, particularly if coughing a lot.   Have others bring food, water, medications, etc., to your door, but avoid direct contact with your household contacts during this time to avoid spreading the infection to them.   If you have a separate bathroom and living quarters during the next 2 weeks away from others, that would be preferable.    If you can't completely isolate, then wear a  mask, wash hands frequently with soap and water for at least 15 seconds, minimize close contact with others, and have a friend or family member check regularly from a distance to make sure you are not getting seriously worse.     You should not be going out in public, should not be going to stores, to work or other public places until all your symptoms have resolved and at least 5 days + 24 hours of no symptoms at all have transpired.   Ideally you should avoid contact with others for a full 5 days if possible.  One of the goals is to limit spread to high risk people; people that are older and elderly, people with multiple health issues like diabetes, heart disease, lung disease, and anybody that has weakened immune systems such as people with cancer or on immunosuppressive therapy.

## 2021-09-22 NOTE — Progress Notes (Signed)
Subjective:     Patient ID: Diane Benjamin, female   DOB: Dec 01, 1970, 51 y.o.   MRN: [id number]  This visit type was conducted due to national recommendations for restrictions regarding the COVID-19 Pandemic (e.g. social distancing) in an effort to limit this patient's exposure and mitigate transmission in our community.  Due to their co-morbid illnesses, this patient is at least at moderate risk for complications without adequate follow up.  This format is felt to be most appropriate for this patient at this time.    Documentation for virtual audio and video telecommunications through [facility] encounter:  The patient was located at home. The provider was located in the office. The patient did consent to this visit and is aware of possible charges through their insurance for this visit.  The other persons participating in this telemedicine service were none. Time spent on call was 20 minutes and in review of previous records 20 minutes total.  This virtual service is not related to other E/M service within previous 7 days.   HPI Chief Complaint  Patient presents with   exposed to covid    Exposed to covid- negative covid test, sore throat, congestion, right eye crusted shut. Alittle cough   Virtual consult for exposure to covid.   Her coworker tested positive for covid today.  She has been around other coworkers who are sick now as well.  She awoke with symptoms today.   She notes sore throat, congestion in head only, but is always congested,has chronic sinus problems.   Right eye red, crusted shut this morning, itching.  No cough.  No fever, no body aches ,no chills, no NVD.  No change in taste or smell.  Had covid test negative this morning at home.   Sore throat is 5/10 pain.   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   Not currently taking anything for symptoms.     Ended up having sinus surgery this past year.   ENT felt as though covid had destroyed the cilia in her  nose and sinuses.   Uses her daily allergy medication, azelastine spray.  She thinks she may have had covid this past year per ENT although tested negative.  No other aggravating or relieving factors. No other complaint.  Past Medical History:  Diagnosis Date   Basal cell carcinoma    Current Outpatient Medications on File Prior to Visit  Medication Sig Dispense Refill   acetaminophen (TYLENOL) 500 MG tablet Take 500 mg by mouth every 6 (six) hours as needed.     Azelastine HCl 137 MCG/SPRAY SOLN SMARTSIG:1-2 Spray(s) Both Nares Twice Daily     EPINEPHrine 0.3 mg/0.3 mL IJ SOAJ injection Inject into the muscle as directed.     XHANCE 93 MCG/ACT EXHU Place 2 sprays into both nostrils 2 (two) times daily.     No current facility-administered medications on file prior to visit.     Review of Systems As in subjective    Objective:   Physical Exam Due to coronavirus pandemic stay at home measures, patient visit was virtual and they were not examined in person.   Wt 109 lb (49.4 kg)    LMP 11/30/2017 (LMP Unknown)    BMI 19.31 kg/m   Gen: wd, wn, nad Right eye with mild pinkish hugh, no obvious discharge No labored breathing or wheezing     Assessment:     Encounter Diagnoses  Name Primary?   Close exposure to COVID-19  virus Yes   Acute conjunctivitis of right eye, unspecified acute conjunctivitis type    Sore throat        Plan:     Symptoms suggest possible cold vs covid infection.  She has had close covid contact this week.   Discussed supportive measures, rest, hydration, and discussed supportive measures for viral conjunctivitis.   If worse conjunctivas symptoms with purulent discharge, pain or much worse reddening, then begin drops below  Patient Instructions  Covid precautions, possible covid infection  General recommendations: I recommend you rest, hydrate well with water and clear fluids throughout the day.   You can use Tylenol for pain or fever You can  use over the counter Delsym for cough. You can use over the counter Emetrol for nausea.    You can use mucinex or sudafed or other sinus decongestant over the counter if needed  If you get worse red eye, purulent discharge from the eye, very goupy eye over the weekend then use the antibiotic eye drops sent to the pharmacy.   If you are having trouble breathing, if you are very weak, have high fever 103 or higher consistently despite Tylenol, or uncontrollable nausea and vomiting, then call or go to the emergency department.    If you have other questions or have other symptoms or questions you are concerned about then please make a virtual visit  Covid symptoms such as fatigue and cough can linger over 2 weeks, even after the initial fever, aches, chills, and other initial symptoms.   Self Quarantine: The CDC, Centers for Disease Control has recommended a self quarantine of 5 days from the start of your illness until you are symptom-free including at least 24 hours of no symptoms including no fever, no shortness of breath, and no body aches and chills, by day 5 before returning to work or general contact with the public.  What does self quarantine mean: avoiding contact with people as much as possible.   Particularly in your house, isolate your self from others in a separate room, wear a mask when possible in the room, particularly if coughing a lot.   Have others bring food, water, medications, etc., to your door, but avoid direct contact with your household contacts during this time to avoid spreading the infection to them.   If you have a separate bathroom and living quarters during the next 2 weeks away from others, that would be preferable.    If you can't completely isolate, then wear a mask, wash hands frequently with soap and water for at least 15 seconds, minimize close contact with others, and have a friend or family member check regularly from a distance to make sure you are not getting  seriously worse.     You should not be going out in public, should not be going to stores, to work or other public places until all your symptoms have resolved and at least 5 days + 24 hours of no symptoms at all have transpired.   Ideally you should avoid contact with others for a full 5 days if possible.  One of the goals is to limit spread to high risk people; people that are older and elderly, people with multiple health issues like diabetes, heart disease, lung disease, and anybody that has weakened immune systems such as people with cancer or on immunosuppressive therapy.       Mayar was seen today for exposed to covid.  Diagnoses and all orders for this visit:  Close exposure to COVID-19 virus  Acute conjunctivitis of right eye, unspecified acute conjunctivitis type  Sore throat  Other orders -     trimethoprim-polymyxin b (POLYTRIM) ophthalmic solution; Place 2 drops into the right eye every 6 (six) hours.    F/u prn

## 2022-01-03 ENCOUNTER — Ambulatory Visit (INDEPENDENT_AMBULATORY_CARE_PROVIDER_SITE_OTHER): Payer: BC Managed Care – PPO

## 2022-01-03 ENCOUNTER — Ambulatory Visit: Payer: BC Managed Care – PPO | Admitting: Podiatry

## 2022-01-03 DIAGNOSIS — M2011 Hallux valgus (acquired), right foot: Secondary | ICD-10-CM

## 2022-01-03 DIAGNOSIS — M79671 Pain in right foot: Secondary | ICD-10-CM

## 2022-01-03 DIAGNOSIS — Z01818 Encounter for other preprocedural examination: Secondary | ICD-10-CM | POA: Diagnosis not present

## 2022-01-04 ENCOUNTER — Encounter: Payer: Self-pay | Admitting: Podiatry

## 2022-01-04 NOTE — Progress Notes (Signed)
Subjective:  Patient ID: Diane Benjamin, female    DOB: 1970-11-07,  MRN: [id number]  No chief complaint on file.   51 y.o. female presents with the above complaint.  Patient presents with complaint of right painful bunion deformity.  Patient states is painful to touch is progressive gotten worse.  She states it hurts with ambulation she wanted to get it evaluated she has not seen anyone else prior to seeing me.  She denies any other acute complaints.  She states she has tried all conservative treatment options including making shoe gear modification padding protecting offloading none of which has helped.  She would like to discuss surgical options today.  Her pain scale is 8 out of 10 especially with ambulation and pressure.  Review of Systems: Negative except as noted in the HPI. Denies N/V/F/Ch.  Past Medical History:  Diagnosis Date   Basal cell carcinoma     Current Outpatient Medications:    acetaminophen (TYLENOL) 500 MG tablet, Take 500 mg by mouth every 6 (six) hours as needed., Disp: , Rfl:    Azelastine HCl 137 MCG/SPRAY SOLN, SMARTSIG:1-2 Spray(s) Both Nares Twice Daily, Disp: , Rfl:    EPINEPHrine 0.3 mg/0.3 mL IJ SOAJ injection, Inject into the muscle as directed., Disp: , Rfl:    trimethoprim-polymyxin b (POLYTRIM) ophthalmic solution, Place 2 drops into the right eye every 6 (six) hours., Disp: 10 mL, Rfl: 0   XHANCE 93 MCG/ACT EXHU, Place 2 sprays into both nostrils 2 (two) times daily., Disp: , Rfl:   Social History   Tobacco Use  Smoking Status Never  Smokeless Tobacco Never    Allergies  Allergen Reactions   Erythromycin     Facial swelling   Keflex [Cephalexin]     itching   Objective:  There were no vitals filed for this visit. There is no height or weight on file to calculate BMI. Constitutional Well developed. Well nourished.  Vascular Dorsalis pedis pulses palpable bilaterally. Posterior tibial pulses palpable bilaterally. Capillary refill normal  to all digits.  No cyanosis or clubbing noted. Pedal hair growth normal.  Neurologic Normal speech. Oriented to person, place, and time. Epicritic sensation to light touch grossly present bilaterally.  Dermatologic Nails well groomed and normal in appearance. No open wounds. No skin lesions.  Orthopedic: Normal joint ROM without pain or crepitus bilaterally. Hallux abductovalgus deformity present Left 1st MPJ diminished range of motion. Left 1st TMT without gross hypermobility. Right 1st MPJ diminished range of motion  Right 1st TMT without gross hypermobility. Lesser digital contractures absent bilaterally.   Radiographs: Taken and reviewed. Hallux abductovalgus deformity present. Metatarsal parabola normal. 1st/2nd IMA: Moderate 13 degrees; TSP: 5 out of 7.  There is increasing hallux valgus deformity.  Assessment:   1. Hav (hallux abducto valgus), right   2. Encounter for preoperative examination for general surgical procedure    Plan:  Patient was evaluated and treated and all questions answered.  Hallux abductovalgus deformity, right -XR as above. -Patient has failed all conservative therapy and wishes to proceed with surgical intervention. All risks, benefits, and alternatives discussed with patient. No guarantees given. Consent reviewed and signed by patient. Post-op course explained at length. -Planned procedures: Chevron osteotomy with a possible phalangeal osteotomy with fixation -Risk factors: None -I discussed my preoperative intra and postop plan in extensive detail.  Patient would like to proceed with surgical intervention as she has failed all conservative treatment options. -Informed surgical risk consent was reviewed and read aloud to the  patient.  I reviewed the films.  I have discussed my findings with the patient in great detail.  I have discussed all risks including but not limited to infection, stiffness, scarring, limp, disability, deformity, damage to blood  vessels and nerves, numbness, poor healing, need for braces, arthritis, chronic pain, amputation, death.  All benefits and realistic expectations discussed in great detail.  I have made no promises as to the outcome.  I have provided realistic expectations.  I have offered the patient a 2nd opinion, which they have declined and assured me they preferred to proceed despite the risks   No follow-ups on file.

## 2022-03-28 ENCOUNTER — Encounter: Payer: BC Managed Care – PPO | Admitting: Podiatry

## 2022-04-11 ENCOUNTER — Encounter: Payer: BC Managed Care – PPO | Admitting: Podiatry

## 2022-04-18 ENCOUNTER — Encounter: Payer: Self-pay | Admitting: Internal Medicine

## 2022-07-02 ENCOUNTER — Telehealth: Payer: Self-pay | Admitting: Podiatry

## 2022-07-02 NOTE — Telephone Encounter (Signed)
DOS: 07/30/2022  [employer] State Effective 04/13/2022  [place] Bunionectomy Rt [id number]) Double Osteotomy Rt (02714)  DX: M20.11  Deductible: $1,250 with $1,250 remaining Out-of-Pocket: $4,890 with $4,540.84 remaining CoInsurance: 20%  Prior authorization is not required per Noel Christmas.  Call Reference #: [id number]

## 2022-07-04 ENCOUNTER — Telehealth: Payer: Self-pay

## 2022-07-04 NOTE — Telephone Encounter (Signed)
Diane Benjamin called and left mw a message wanting to reschedule her surgery with Dr. Posey Pronto on 07/30/2022. She stated she can't afford to have surgery right now. She want to wait till the summer. She was last seen on 01/03/2022 so she will need an office visit when she is ready to reschedule her surgery. I returned her call with this information. Notified Dr. Posey Pronto and Caren Griffins w/[facility]

## 2022-08-08 ENCOUNTER — Encounter: Payer: BC Managed Care – PPO | Admitting: Podiatry

## 2022-08-22 ENCOUNTER — Encounter: Payer: BC Managed Care – PPO | Admitting: Podiatry

## 2022-10-01 ENCOUNTER — Other Ambulatory Visit: Payer: Self-pay | Admitting: Obstetrics and Gynecology

## 2022-10-01 DIAGNOSIS — Z1231 Encounter for screening mammogram for malignant neoplasm of breast: Secondary | ICD-10-CM

## 2022-10-24 ENCOUNTER — Telehealth (INDEPENDENT_AMBULATORY_CARE_PROVIDER_SITE_OTHER): Payer: BC Managed Care – PPO | Admitting: Family Medicine

## 2022-10-24 ENCOUNTER — Encounter: Payer: Self-pay | Admitting: Family Medicine

## 2022-10-24 VITALS — Temp 98.4°F | Wt 110.0 lb

## 2022-10-24 DIAGNOSIS — J309 Allergic rhinitis, unspecified: Secondary | ICD-10-CM | POA: Diagnosis not present

## 2022-10-24 DIAGNOSIS — R058 Other specified cough: Secondary | ICD-10-CM

## 2022-10-24 DIAGNOSIS — Z9889 Other specified postprocedural states: Secondary | ICD-10-CM

## 2022-10-24 DIAGNOSIS — Z8709 Personal history of other diseases of the respiratory system: Secondary | ICD-10-CM

## 2022-10-24 MED ORDER — AMOXICILLIN-POT CLAVULANATE 875-125 MG PO TABS: 1.0000 | ORAL_TABLET | Freq: Two times a day (BID) | ORAL | 0 refills | Status: DC

## 2022-10-24 NOTE — Progress Notes (Signed)
   Subjective:    Patient ID: Diane Benjamin, female    DOB: [birth year], 52 y.o.   MRN: [id number]  HPI Documentation for virtual audio and video telecommunications through [place] encounter: The patient was located at home. 2 patient identifiers used.  The provider was located in the office. The patient did consent to this visit and is aware of possible charges through their insurance for this visit. The other persons participating in this telemedicine service were none. Time spent on call was 5 minutes and in review of previous records >20 minutes total for counseling and coordination of care. This virtual service is not related to other E/M service within previous 7 days.  She has a previous history of difficulty with allergic rhinitis as well as sinusitis.  She had a CT scan as well as seeing ENT and having surgery in 2022.  She continues on her allergy medications as well as nasal irrigation regularly to keep this under control.  She was treated for sinus infection with prednisone and Levaquin and then switched to doxycycline in December.  Since January she has had difficulty with postnasal drainage and cough and now the cough has become productive in the last several days but no fever, chills, shortness of breath.  Review of Systems     Objective:   Physical Exam Alert and in no distress otherwise not examined       Assessment & Plan:  Productive cough - Plan: amoxicillin-clavulanate (AUGMENTIN) 875-125 MG tablet  Allergic rhinitis, unspecified seasonality, unspecified trigger  History of sinus surgery  History of sinusitis She has a long history of sinus disease and seems to have this under very good control but recently she has developed a productive cough and her allergy sinus symptoms seem to be under much better control.  I will place her on Augmentin and she is scheduled for complete exam in roughly 2 weeks.

## 2022-10-31 ENCOUNTER — Ambulatory Visit
Admission: RE | Admit: 2022-10-31 | Discharge: 2022-10-31 | Disposition: A | Discharge status: Home / Self Care | Payer: BC Managed Care – PPO | Source: Ambulatory Visit | Attending: Otolaryngology | Admitting: Otolaryngology

## 2022-10-31 ENCOUNTER — Other Ambulatory Visit: Payer: Self-pay | Admitting: Otolaryngology

## 2022-10-31 DIAGNOSIS — R059 Cough, unspecified: Secondary | ICD-10-CM

## 2022-10-31 NOTE — Progress Notes (Signed)
52 y.o. G25P0002 Divorced White or Caucasian Not Hispanic or Latino female here for annual exam.  Patient would like blood work done.   Patient states that she is convinced she is dying. Her ENT did  blood work for autoimmune disease. She also had two melanoma's in the last two years.  She is having ongoing issues with her sinuses. Repeated infections, she has had surgery on her sinuses, was told she needs more. She has been referred to [facility].   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 The gym is very old, concerned about mold in the gym.   She c/o pain in her right breast vs chest wall. She does have chronic coughing.   She has had changes in her finger nails.  She has cut dairy out of her diet completely, she gets a multi vit. She eats chicken and lots of veggies.   No weight changes in the last 3 years. In general no fevers, normal appetite. No nausea or vomiting. Typically she has a BM daily. Normal bladder function.  She went through a divorce 3 years ago, very stressful. She is planning on talking to a counselor.   No vaginal bleeding. Not sexually active. No significant vasomotor symptoms.    Patient's last menstrual period was 11/30/2017 (lmp unknown).          Sexually active: No.  The current method of family planning is post menopausal status.    Exercising: Yes.     Walking  Smoker:  no  Health Maintenance: Pap:  12/02/19 WNL HR HPV neg; 03/29/15 WNL hr HPV neg  History of abnormal Pap:  no MMG:  06/08/20 density D bi-rads 1 neg Scheduled for 11/09/22  BMD:   n/a Colonoscopy: none TDaP:  03/29/15 Gardasil: n/a   reports that she has never smoked. She has never used smokeless tobacco. She reports current alcohol use of about 10.0 standard drinks of alcohol per week. She reports that she does not use drugs. Kids are 57 and 23.   Past Medical History:  Diagnosis Date   Basal cell carcinoma    Melanoma ([place])    right ankle and left calf.    Past  Surgical History:  Procedure Laterality Date   BASAL CELL CARCINOMA EXCISION     BREAST ENHANCEMENT SURGERY  2009   ETHMOIDECTOMY Bilateral 04/25/2021   Procedure: TOTAL ETHMOIDECTOMY, FRONTAL RECESS EXPLORATION, SPHENOIDOTOMY;  Surgeon: Clyde Canterbury, MD;  Location: [facility];  Service: ENT;  Laterality: Bilateral;   IMAGE GUIDED SINUS SURGERY N/A 04/25/2021   Procedure: IMAGE GUIDED SINUS SURGERY;  Surgeon: Clyde Canterbury, MD;  Location: [facility];  Service: ENT;  Laterality: N/A;  NEEDS TRYKER DISK placed disk on OR CHARGE nurse desk [id number]   MAXILLARY ANTROSTOMY Bilateral 04/25/2021   Procedure: ENDOSCOPICMAXILLARY ANTROSTOMY WITH TISSUE;  Surgeon: Clyde Canterbury, MD;  Location: [facility];  Service: ENT;  Laterality: Bilateral;   RHINOPLASTY  1990    Current Outpatient Medications  Medication Sig Dispense Refill   acetaminophen (TYLENOL) 500 MG tablet Take 500 mg by mouth every 6 (six) hours as needed.     albuterol (VENTOLIN HFA) 108 (90 Base) MCG/ACT inhaler Inhale 2 puffs into the lungs every 6 (six) hours as needed.     doxycycline (VIBRAMYCIN) 100 MG capsule Take 100 mg by mouth 2 (two) times daily.     EPINEPHrine 0.3 mg/0.3 mL IJ SOAJ injection Inject into the muscle as directed.  Azelastine HCl 137 MCG/SPRAY SOLN SMARTSIG:1-2 Spray(s) Both Nares Twice Daily (Patient not taking: Reported on 11/07/2022)     XHANCE 93 MCG/ACT [place] 2 sprays into both nostrils 2 (two) times daily. (Patient not taking: Reported on 11/07/2022)     No current facility-administered medications for this visit.    Family History  Problem Relation Age of Onset   Breast cancer Mother    Parkinson's disease Father    Stroke Maternal Grandfather     Review of Systems  All other systems reviewed and are negative.   Exam:   BP 128/70   Pulse 85   Ht 5\' 3"  (1.6 m)   Wt 107 lb (48.5 kg)   LMP 11/30/2017 (LMP Unknown)   SpO2 100%   BMI 18.95 kg/m   Weight change:  @WEIGHTCHANGE @ Height:   Height: 5\' 3"  (160 cm)  Ht Readings from Last 3 Encounters:  11/07/22 5\' 3"  (1.6 m)  04/25/21 5\' 3"  (1.6 m)  02/01/21 5\' 4"  (1.626 m)    General appearance: alert, cooperative and appears stated age Head: Normocephalic, without obvious abnormality, atraumatic Neck: no adenopathy, supple, symmetrical, trachea midline and thyroid normal to inspection and palpation Lungs: clear to auscultation bilaterally Cardiovascular: regular rate and rhythm Breasts: normal appearance, no masses or tenderness, bilaterally soft implants Abdomen: soft, non-tender; non distended,  no masses,  no organomegaly Extremities: extremities normal, atraumatic, no cyanosis or edema Skin: Skin color, texture, turgor normal. No rashes or lesions Lymph nodes: Cervical, supraclavicular, and axillary nodes normal. No abnormal inguinal nodes palpated Neurologic: Grossly normal   Pelvic: External genitalia:  no lesions              Urethra:  normal appearing urethra with no masses, tenderness or lesions              Bartholins and Skenes: normal                 Vagina: normal appearing vagina with normal color and discharge, no lesions              Cervix: no lesions               Bimanual Exam:  Uterus:  normal size, contour, position, consistency, mobility, non-tender              Adnexa: no mass, fullness, tenderness               Rectovaginal: Confirms               Anus:  normal sphincter tone, no lesions  Gae Dry, CMA chaperoned for the exam.  1. Well woman exam Discussed breast self exam Discussed calcium and vit D intake Mammogram scheduled No pap this year  2. Colon cancer screening - Ambulatory referral to Gastroenterology  3. Laboratory exam ordered as part of routine general medical examination - CBC - Comprehensive metabolic panel  4. Screening, lipid - Lipid panel  5. Vitamin D deficiency No dairy in her diet. - VITAMIN D 25 Hydroxy (Vit-D Deficiency,  Fractures)

## 2022-11-05 ENCOUNTER — Telehealth: Payer: Self-pay

## 2022-11-05 NOTE — Telephone Encounter (Signed)
Has AEX scheduled on Weds 11/07/22 with Dr. Talbert Nan. Patient reports that she has been diagnosed with a sinus infection that is a staph infection. She is on an antibiotic that she started today after sensitivity showed the bacteria sensitive to current antibiotic and questions if Dr. Talbert Nan okay with her coming or should she reschedule.  I told her that I would check with Dr. Talbert Nan but since she will have been on the antibiotic 48 hours by then probably will be fine to keep appt but I will call her tomorrow. She knows Dr. Lenna Sciara out of office today.

## 2022-11-06 NOTE — Telephone Encounter (Signed)
I think it is fine for her to come. I would just request that she wear a mask if she is still symptomatic.

## 2022-11-06 NOTE — Telephone Encounter (Signed)
Spoke with patient and informed her. °

## 2022-11-07 ENCOUNTER — Ambulatory Visit (INDEPENDENT_AMBULATORY_CARE_PROVIDER_SITE_OTHER): Payer: BC Managed Care – PPO | Admitting: Obstetrics and Gynecology

## 2022-11-07 ENCOUNTER — Encounter: Payer: Self-pay | Admitting: Obstetrics and Gynecology

## 2022-11-07 VITALS — BP 128/70 | HR 85 | Ht 63.0 in | Wt 107.0 lb

## 2022-11-07 DIAGNOSIS — Z01419 Encounter for gynecological examination (general) (routine) without abnormal findings: Secondary | ICD-10-CM

## 2022-11-07 DIAGNOSIS — Z13220 Encounter for screening for lipoid disorders: Secondary | ICD-10-CM

## 2022-11-07 DIAGNOSIS — E559 Vitamin D deficiency, unspecified: Secondary | ICD-10-CM

## 2022-11-07 DIAGNOSIS — Z1211 Encounter for screening for malignant neoplasm of colon: Secondary | ICD-10-CM

## 2022-11-07 DIAGNOSIS — Z0000 Encounter for general adult medical examination without abnormal findings: Secondary | ICD-10-CM

## 2022-11-07 NOTE — Patient Instructions (Signed)

## 2022-11-09 ENCOUNTER — Ambulatory Visit
Admission: RE | Admit: 2022-11-09 | Discharge: 2022-11-09 | Disposition: A | Discharge status: Home / Self Care | Payer: BC Managed Care – PPO | Source: Ambulatory Visit

## 2022-11-09 DIAGNOSIS — Z1231 Encounter for screening mammogram for malignant neoplasm of breast: Secondary | ICD-10-CM

## 2022-11-10 LAB — LIPID PANEL
Cholesterol: 210 mg/dL — ABNORMAL HIGH (ref <200)
HDL: 61 mg/dL (ref >=50)
LDL Cholesterol (Calc): 126 mg/dL (calc) — ABNORMAL HIGH
Non-HDL Cholesterol (Calc): 149 mg/dL (calc) — ABNORMAL HIGH (ref <130)
Total CHOL/HDL Ratio: 3.4 (calc) (ref <5.0)
Triglycerides: 123 mg/dL (ref <150)

## 2022-11-10 LAB — CBC
HCT: 39.8 % (ref 35.0–45.0)
Hemoglobin: 13.1 g/dL (ref 11.7–15.5)
MCH: 30.8 pg (ref 27.0–33.0)
MCHC: 32.9 g/dL (ref 32.0–36.0)
MCV: 93.6 fL (ref 80.0–100.0)
MPV: 10.9 fL (ref 7.5–12.5)
Platelets: 227 10*3/uL (ref 140–400)
RBC: 4.25 10*6/uL (ref 3.80–5.10)
RDW: 11.4 % (ref 11.0–15.0)
WBC: 5.8 10*3/uL (ref 3.8–10.8)

## 2022-11-10 LAB — COMPREHENSIVE METABOLIC PANEL
AG Ratio: 1.9 (calc) (ref 1.0–2.5)
ALT: 22 U/L (ref 6–29)
AST: 20 U/L (ref 10–35)
Albumin: 4.4 g/dL (ref 3.6–5.1)
Alkaline phosphatase (APISO): 52 U/L (ref 37–153)
BUN: 12 mg/dL (ref 7–25)
CO2: 30 mmol/L (ref 20–32)
Calcium: 9.3 mg/dL (ref 8.6–10.4)
Chloride: 101 mmol/L (ref 98–110)
Creat: 0.8 mg/dL (ref 0.50–1.03)
Globulin: 2.3 g/dL (calc) (ref 1.9–3.7)
Glucose, Bld: 109 mg/dL — ABNORMAL HIGH (ref 65–99)
Potassium: 4.6 mmol/L (ref 3.5–5.3)
Sodium: 139 mmol/L (ref 135–146)
Total Bilirubin: 0.4 mg/dL (ref 0.2–1.2)
Total Protein: 6.7 g/dL (ref 6.1–8.1)

## 2022-11-10 LAB — HEMOGLOBIN A1C W/OUT EAG: Hgb A1c MFr Bld: 5.5 % of total Hgb (ref <5.7)

## 2022-11-10 LAB — VITAMIN D 25 HYDROXY (VIT D DEFICIENCY, FRACTURES): Vit D, 25-Hydroxy: 45 ng/mL (ref 30–100)

## 2022-12-09 ENCOUNTER — Ambulatory Visit
Admission: EM | Admit: 2022-12-09 | Discharge: 2022-12-09 | Disposition: A | Discharge status: Home / Self Care | Payer: BC Managed Care – PPO

## 2022-12-09 DIAGNOSIS — L500 Allergic urticaria: Secondary | ICD-10-CM | POA: Diagnosis not present

## 2022-12-09 DIAGNOSIS — R062 Wheezing: Secondary | ICD-10-CM

## 2022-12-09 DIAGNOSIS — J4530 Mild persistent asthma, uncomplicated: Secondary | ICD-10-CM | POA: Diagnosis not present

## 2022-12-09 MED ORDER — PREDNISONE 20 MG PO TABS: ORAL_TABLET | ORAL | 0 refills | Status: DC

## 2022-12-09 NOTE — ED Triage Notes (Signed)
Pt reports rash all over the body and wheezing x 2 days. Reports she had a Staph infection 1 month ago.

## 2022-12-09 NOTE — ED Provider Notes (Signed)
[facility]  Note:  This document was prepared using Conservation officer, historic buildings and may include unintentional dictation errors.  MRN: [id number] DOB: 11-30-1970  Subjective:   Diane Benjamin is a 52 y.o. female presenting for 2-day history of red spots all over her body including the face.  Has also been itching.  Felt like she was wheezing and was held by her albuterol inhaler.  She was treated for a staph infection in March but has not taken antibiotics this month.  She did use a new bath soap and has since switched back to her normal soap.  Otherwise, denies eating any new foods, starting new medications, exposure to poisonous plants, new cleaning products or detergents.  Gets allergy shots weekly.  No current facility-administered medications for this encounter.  Current Outpatient Medications:    fexofenadine (ALLEGRA) 60 MG tablet, Take 60 mg by mouth 2 (two) times daily., Disp: , Rfl:    acetaminophen (TYLENOL) 500 MG tablet, Take 500 mg by mouth every 6 (six) hours as needed., Disp: , Rfl:    albuterol (VENTOLIN HFA) 108 (90 Base) MCG/ACT inhaler, Inhale 2 puffs into the lungs every 6 (six) hours as needed., Disp: , Rfl:    Azelastine HCl 137 MCG/SPRAY SOLN, , Disp: , Rfl:    doxycycline (VIBRAMYCIN) 100 MG capsule, Take 100 mg by mouth 2 (two) times daily., Disp: , Rfl:    EPINEPHrine 0.3 mg/0.3 mL IJ SOAJ injection, Inject into the muscle as directed., Disp: , Rfl:    XHANCE 93 MCG/ACT EXHU, Place 2 sprays into both nostrils 2 (two) times daily., Disp: , Rfl:    Allergies  Allergen Reactions   Erythromycin     Facial swelling   Keflex [Cephalexin]     itching    Past Medical History:  Diagnosis Date   Basal cell carcinoma    Melanoma (HCC)    right ankle and left calf.     Past Surgical History:  Procedure Laterality Date   BASAL CELL CARCINOMA EXCISION     BREAST ENHANCEMENT SURGERY  2009   ETHMOIDECTOMY Bilateral 04/25/2021    Procedure: TOTAL ETHMOIDECTOMY, FRONTAL RECESS EXPLORATION, SPHENOIDOTOMY;  Surgeon: Geanie Logan, MD;  Location: [facility];  Service: ENT;  Laterality: Bilateral;   IMAGE GUIDED SINUS SURGERY N/A 04/25/2021   Procedure: IMAGE GUIDED SINUS SURGERY;  Surgeon: Geanie Logan, MD;  Location: [facility];  Service: ENT;  Laterality: N/A;  NEEDS TRYKER DISK placed disk on OR CHARGE nurse desk 9-7-kp   MAXILLARY ANTROSTOMY Bilateral 04/25/2021   Procedure: ENDOSCOPICMAXILLARY ANTROSTOMY WITH TISSUE;  Surgeon: Geanie Logan, MD;  Location: [facility];  Service: ENT;  Laterality: Bilateral;   RHINOPLASTY  1990    Family History  Problem Relation Age of Onset   Breast cancer Mother 55   Parkinson's disease Father    Stroke Maternal Grandfather     Social History   Tobacco Use   Smoking status: Never   Smokeless tobacco: Never  Vaping Use   Vaping Use: Never used  Substance Use Topics   Alcohol use: Yes    Alcohol/week: 10.0 standard drinks of alcohol    Types: 10 Glasses of wine per week   Drug use: No    ROS   Objective:   Vitals: BP 132/84 (BP Location: Right Arm)   Pulse 84   Temp 98.7 F (37.1 C) (Oral)   Resp 16   LMP 11/30/2017 (LMP Unknown)   SpO2 97%  Physical Exam Constitutional:      General: She is not in acute distress.    Appearance: Normal appearance. She is well-developed and normal weight. She is not ill-appearing, toxic-appearing or diaphoretic.  HENT:     Head: Normocephalic and atraumatic.     Right Ear: Tympanic membrane, ear canal and external ear normal. No drainage or tenderness. No middle ear effusion. There is no impacted cerumen. Tympanic membrane is not erythematous or bulging.     Left Ear: Tympanic membrane, ear canal and external ear normal. No drainage or tenderness.  No middle ear effusion. There is no impacted cerumen. Tympanic membrane is not erythematous or bulging.     Nose: Nose normal. No congestion or  rhinorrhea.     Mouth/Throat:     Mouth: Mucous membranes are moist. No oral lesions.     Pharynx: No pharyngeal swelling, oropharyngeal exudate, posterior oropharyngeal erythema or uvula swelling.     Tonsils: No tonsillar exudate or tonsillar abscesses.     Comments: Airway is patent. Eyes:     General: No scleral icterus.       Right eye: No discharge.        Left eye: No discharge.     Extraocular Movements: Extraocular movements intact.     Right eye: Normal extraocular motion.     Left eye: Normal extraocular motion.     Conjunctiva/sclera: Conjunctivae normal.  Cardiovascular:     Rate and Rhythm: Normal rate and regular rhythm.     Heart sounds: Normal heart sounds. No murmur heard.    No friction rub. No gallop.  Pulmonary:     Effort: Pulmonary effort is normal. No respiratory distress.     Breath sounds: No stridor. No wheezing, rhonchi or rales.  Chest:     Chest wall: No tenderness.  Musculoskeletal:     Cervical back: Normal range of motion and neck supple.  Lymphadenopathy:     Cervical: No cervical adenopathy.  Skin:    General: Skin is warm and dry.     Findings: Rash (diffuse patches of urticarial lesions worst over the torso, thighs, extremities and to a lesser degree over the malar areas) present.  Neurological:     General: No focal deficit present.     Mental Status: She is alert and oriented to person, place, and time.  Psychiatric:        Mood and Affect: Mood normal.        Behavior: Behavior normal.     Assessment and Plan :   PDMP not reviewed this encounter.  1. Allergic urticaria   2. Wheezing   3. Mild persistent asthma, uncomplicated     Recommended an oral prednisone course, hydroxyzine, maintain albuterol inhaler and general allergic treatments. Counseled patient on potential for adverse effects with medications prescribed/recommended today, ER and return-to-clinic precautions discussed, patient verbalized understanding.    Wallis Bamberg, [place] 12/09/22 1558

## 2023-01-01 DIAGNOSIS — J329 Chronic sinusitis, unspecified: Secondary | ICD-10-CM | POA: Insufficient documentation

## 2023-02-22 ENCOUNTER — Ambulatory Visit (INDEPENDENT_AMBULATORY_CARE_PROVIDER_SITE_OTHER): Payer: BC Managed Care – PPO

## 2023-02-22 ENCOUNTER — Ambulatory Visit: Payer: BC Managed Care – PPO | Admitting: Podiatry

## 2023-02-22 DIAGNOSIS — M2011 Hallux valgus (acquired), right foot: Secondary | ICD-10-CM

## 2023-02-22 DIAGNOSIS — Z01818 Encounter for other preprocedural examination: Secondary | ICD-10-CM | POA: Diagnosis not present

## 2023-02-22 NOTE — Progress Notes (Signed)
Subjective:  Patient ID: Diane Benjamin, female    DOB: 08/19/1970,  MRN: [id number]  Chief Complaint  Patient presents with   Bunions    Pt stated that she wants to discuss surgery     52 y.o. female presents with the above complaint.  Patient presents with complaint of right painful bunion deformity.  Patient states is painful to touch is progressive gotten worse.  She states is still continues to bother her.  She is to all conservative care is progressive gotten worse.  She is ready to have the surgery taken care of.  Denies any other acute complaints  Review of Systems: Negative except as noted in the HPI. Denies N/V/F/Ch.  Past Medical History:  Diagnosis Date   Basal cell carcinoma    Melanoma (HCC)    right ankle and left calf.    Current Outpatient Medications:    acetaminophen (TYLENOL) 500 MG tablet, Take 500 mg by mouth every 6 (six) hours as needed., Disp: , Rfl:    albuterol (VENTOLIN HFA) 108 (90 Base) MCG/ACT inhaler, Inhale 2 puffs into the lungs every 6 (six) hours as needed., Disp: , Rfl:    Azelastine HCl 137 MCG/SPRAY SOLN, , Disp: , Rfl:    doxycycline (VIBRAMYCIN) 100 MG capsule, Take 100 mg by mouth 2 (two) times daily., Disp: , Rfl:    EPINEPHrine 0.3 mg/0.3 mL IJ SOAJ injection, Inject into the muscle as directed., Disp: , Rfl:    fexofenadine (ALLEGRA) 60 MG tablet, Take 60 mg by mouth 2 (two) times daily., Disp: , Rfl:    predniSONE (DELTASONE) 20 MG tablet, Take 2 tablets daily with breakfast., Disp: 10 tablet, Rfl: 0   XHANCE 93 MCG/ACT EXHU, Place 2 sprays into both nostrils 2 (two) times daily., Disp: , Rfl:   Social History   Tobacco Use  Smoking Status Never  Smokeless Tobacco Never    Allergies  Allergen Reactions   Erythromycin     Facial swelling   Keflex [Cephalexin]     itching   Objective:  There were no vitals filed for this visit. There is no height or weight on file to calculate BMI. Constitutional Well developed. Well  nourished.  Vascular Dorsalis pedis pulses palpable bilaterally. Posterior tibial pulses palpable bilaterally. Capillary refill normal to all digits.  No cyanosis or clubbing noted. Pedal hair growth normal.  Neurologic Normal speech. Oriented to person, place, and time. Epicritic sensation to light touch grossly present bilaterally.  Dermatologic Nails well groomed and normal in appearance. No open wounds. No skin lesions.  Orthopedic: Normal joint ROM without pain or crepitus bilaterally. Hallux abductovalgus deformity present Left 1st MPJ diminished range of motion. Left 1st TMT without gross hypermobility. Right 1st MPJ diminished range of motion  Right 1st TMT without gross hypermobility. Lesser digital contractures absent bilaterally.   Radiographs: Taken and reviewed. Hallux abductovalgus deformity present. Metatarsal parabola normal. 1st/2nd IMA: Moderate 13 degrees; TSP: 5 out of 7.  There is increasing hallux valgus deformity.  Assessment:   1. Hav (hallux abducto valgus), right   2. Encounter for preoperative examination for general surgical procedure    Plan:  Patient was evaluated and treated and all questions answered.  Hallux abductovalgus deformity, right -XR as above. -Patient has failed all conservative therapy and wishes to proceed with surgical intervention. All risks, benefits, and alternatives discussed with patient. No guarantees given. Consent reviewed and signed by patient. Post-op course explained at length. -Planned procedures: Chevron osteotomy with a possible  phalangeal osteotomy with fixation -Risk factors: None -I discussed my preoperative intra and postop plan in extensive detail.  Patient would like to proceed with surgical intervention as she has failed all conservative treatment options. -Informed surgical risk consent was reviewed and read aloud to the patient.  I reviewed the films.  I have discussed my findings with the patient in great  detail.  I have discussed all risks including but not limited to infection, stiffness, scarring, limp, disability, deformity, damage to blood vessels and nerves, numbness, poor healing, need for braces, arthritis, chronic pain, amputation, death.  All benefits and realistic expectations discussed in great detail.  I have made no promises as to the outcome.  I have provided realistic expectations.  I have offered the patient a 2nd opinion, which they have declined and assured me they preferred to proceed despite the risks   No follow-ups on file.

## 2023-03-04 ENCOUNTER — Encounter: Payer: Self-pay | Admitting: Obstetrics and Gynecology

## 2023-04-18 ENCOUNTER — Telehealth: Payer: BC Managed Care – PPO | Admitting: Family Medicine

## 2023-04-18 ENCOUNTER — Encounter: Payer: Self-pay | Admitting: Family Medicine

## 2023-04-18 VITALS — Ht 63.0 in | Wt 112.0 lb

## 2023-04-18 DIAGNOSIS — R5383 Other fatigue: Secondary | ICD-10-CM

## 2023-04-18 NOTE — Progress Notes (Signed)
   Subjective:    Patient ID: Diane Benjamin, female    DOB: 1970-11-27, 52 y.o.   MRN: [id number]  HPI Documentation for virtual audio and video telecommunications through Caregility encounter: The patient was located at home. 2 patient identifiers used.  The provider was located in the office. The patient did consent to this visit and is aware of possible charges through their insurance for this visit. The other persons participating in this telemedicine service were none. Time spent on call was 5 minutes and in review of previous records >20 minutes total for counseling and coordination of care. This virtual service is not related to other E/M service within previous 7 days.  She has concerns over thyroid.  Apparently there is a family history of thyroid problems.  She complains that she is lost some of the hair of her eyebrows and does feel fatigue.  She has not noted any other skin or hair changes.  She does have some cold intolerance.  No GI or mental issues that she is aware of.  Review of Systems     Objective:    Physical Exam Alert and in no distress with appropriate affect       Assessment & Plan:  Fatigue I explained that I think we need to have her come in so I can do a more thorough evaluation and look for signs and symptoms of low thyroid and we can also do some blood work on her.

## 2023-04-25 ENCOUNTER — Institutional Professional Consult (permissible substitution): Payer: BC Managed Care – PPO | Admitting: Family Medicine

## 2023-05-29 IMAGING — CT CT HEAD W/O CM
1 series · 16 of 30 positions shown, 20 images · non-contrast
Comparison: None.

CLINICAL DATA: Dizziness, non-specific dizziness, headache, sinus
pressure. Fatigue, unspecified type. Chronic frontal sinusitis.

EXAM:
CT HEAD WITHOUT CONTRAST
TECHNIQUE: Contiguous axial images were obtained from the base of the skull
through the vertex without intravenous contrast.

[Series 2: head w/(date) · axial · 0.45mm/px · z∈[-180,-35]mm · 16 of 33 slices shown, 20 images]
[im 2/33  brain]
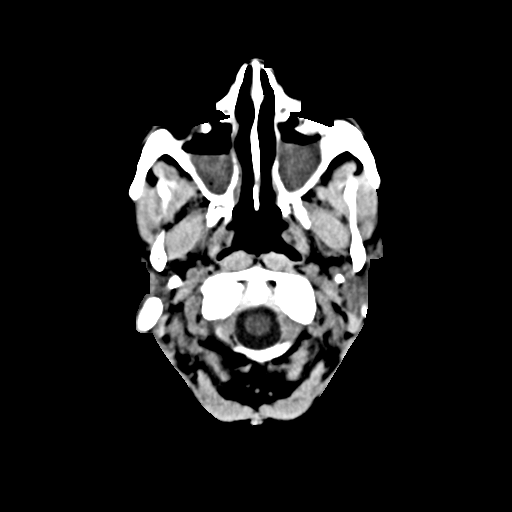
[im 2/33  bone]
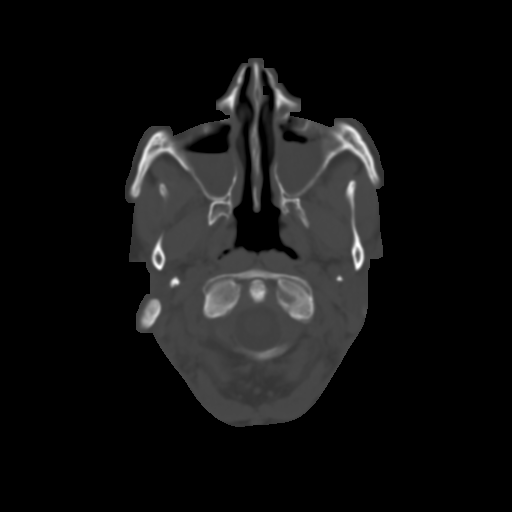
[im 4/33  brain]
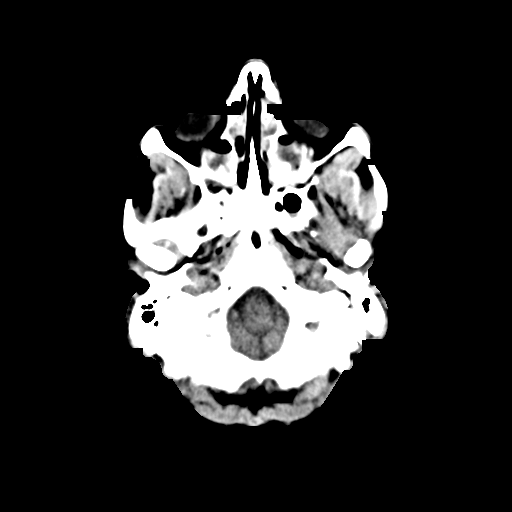
[im 6/33  brain]
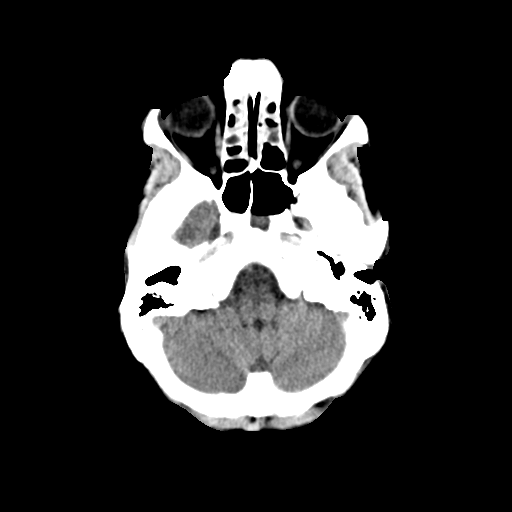
[im 8/33  brain]
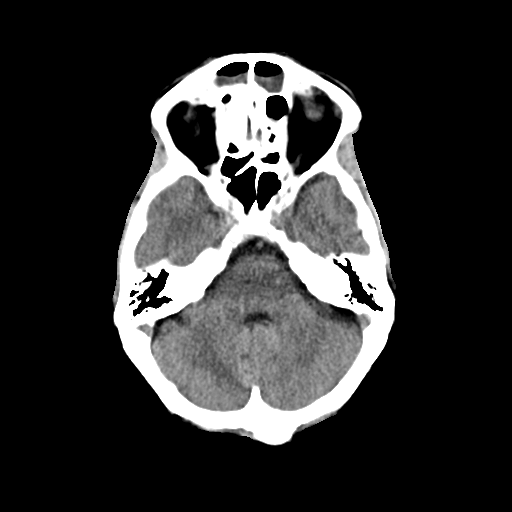
[im 9/33  brain]
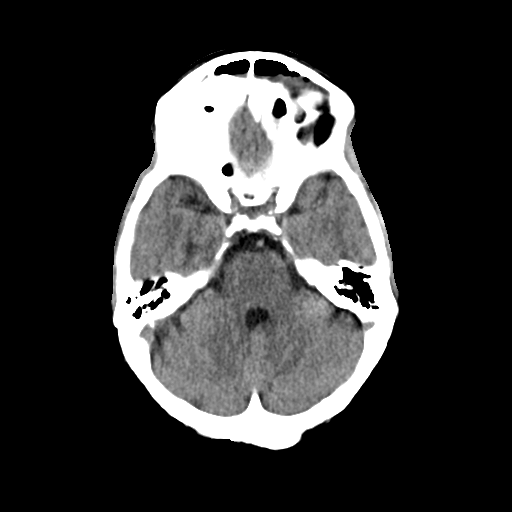
[im 9/33  bone]
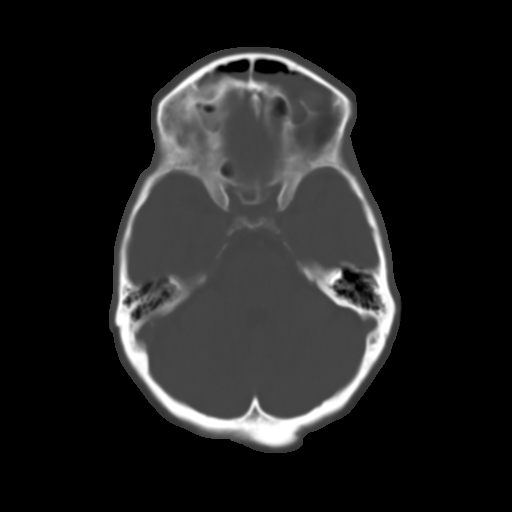
[im 12/33  brain]
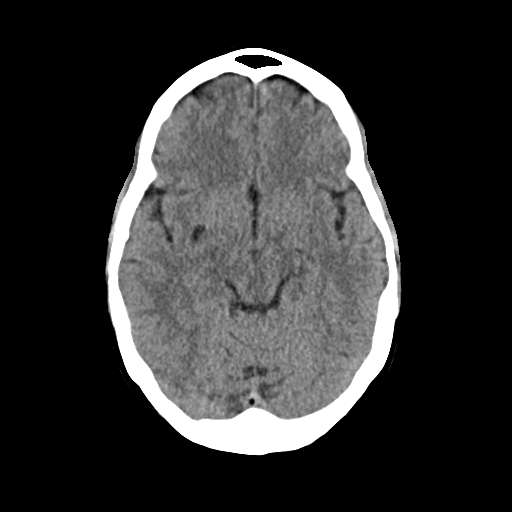
[im 14/33  brain]
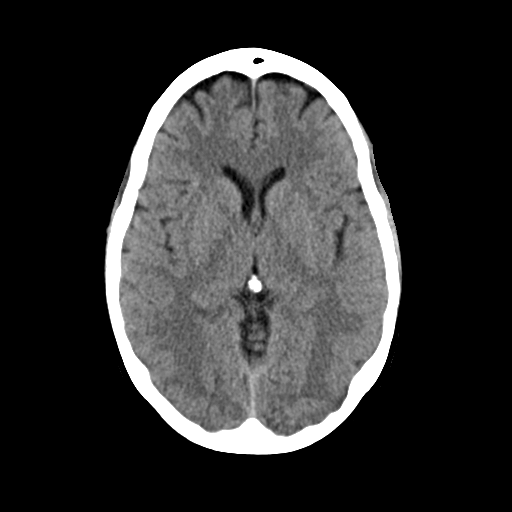
[im 16/33  brain]
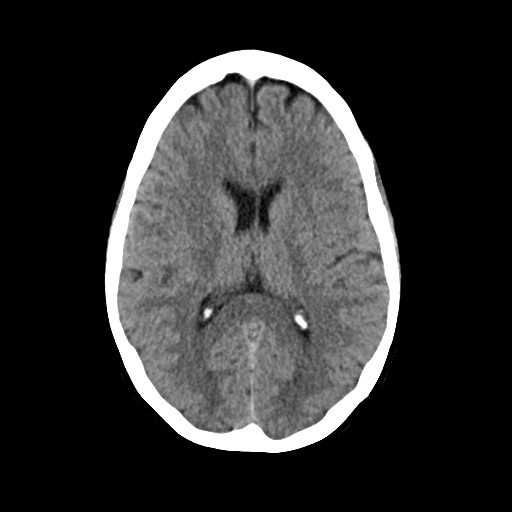
[im 17/33  brain]
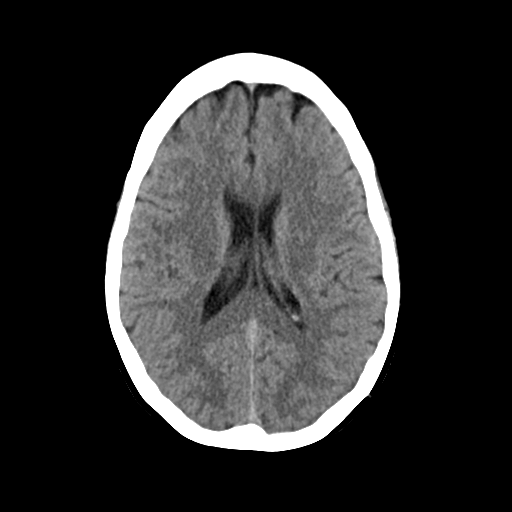
[im 17/33  bone]
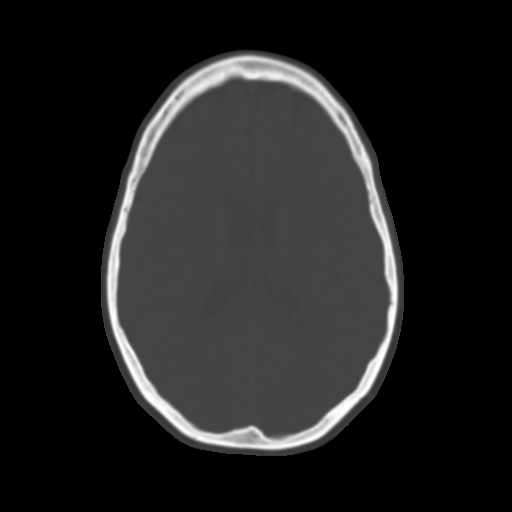
[im 19/33  brain]
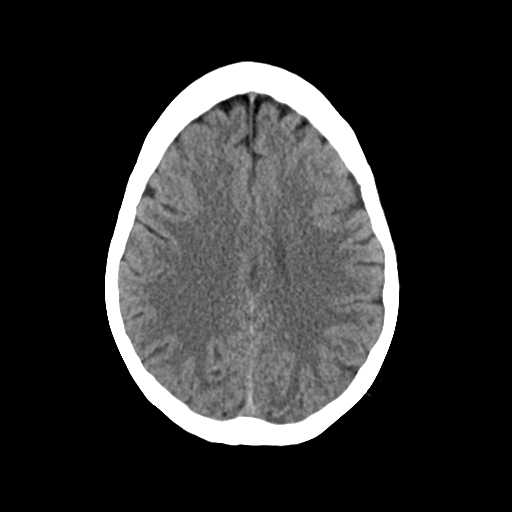
[im 21/33  brain]
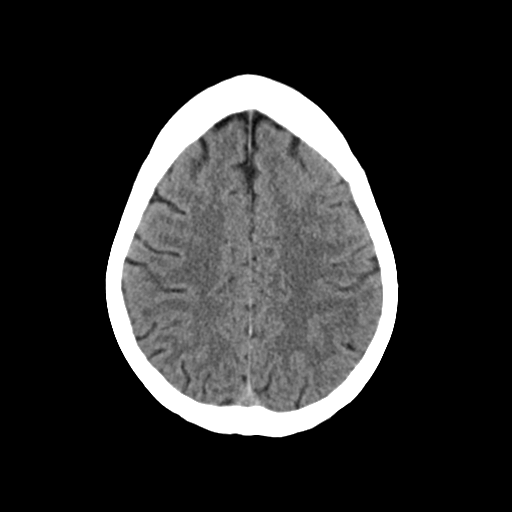
[im 24/33  brain]
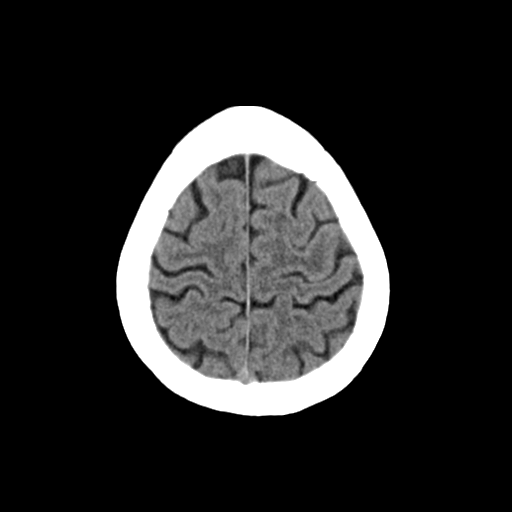
[im 25/33  brain]
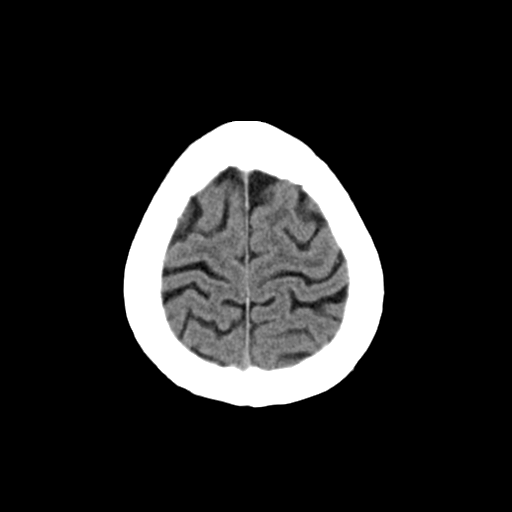
[im 25/33  bone]
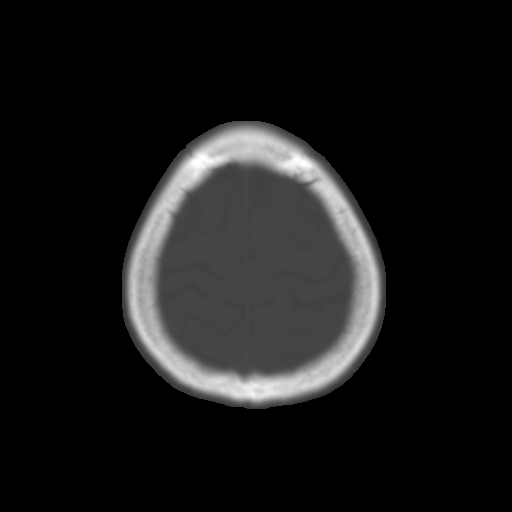
[im 27/33  brain]
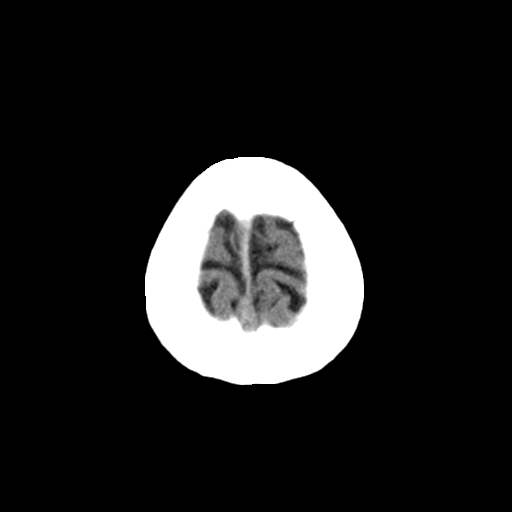
[im 29/33  brain]
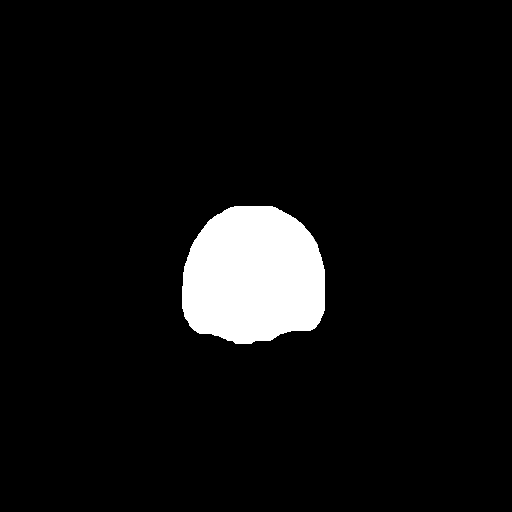
[im 31/33  brain]
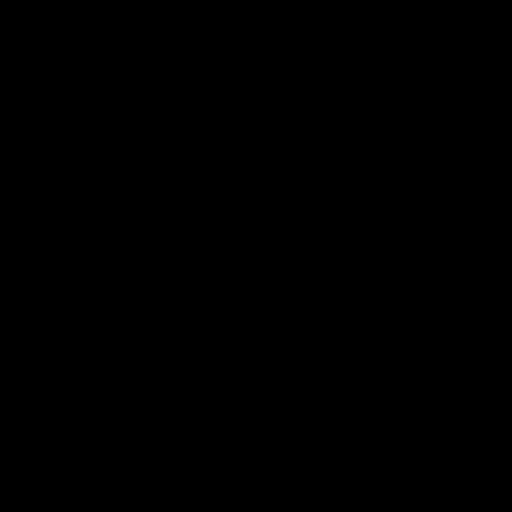

[16 of 30 positions shown; findings below may reference images not displayed]

FINDINGS: Brain: No evidence of acute infarction, hemorrhage, hydrocephalus,
extra-axial collection or mass lesion/mass effect. Incidentally,
there is a dilated perivascular space in the right lower basal
ganglia region.

Vascular: No hyperdense vessel or unexpected calcification.

Skull: Normal. Negative for fracture or focal lesion.

Sinuses/Orbits: Extensive disease involving the frontal sinuses,
ethmoid air cells, sphenoid sinuses and bilateral maxillary sinuses.
Evidence for layering fluid in the sinuses. Mastoid air cells are
aerated.

Other: None.
IMPRESSION: 1. No acute intracranial abnormality.
2. Extensive paranasal sinus disease.

## 2023-06-17 ENCOUNTER — Encounter: Payer: Self-pay | Admitting: Podiatry

## 2023-06-27 ENCOUNTER — Telehealth: Payer: Self-pay | Admitting: Podiatry

## 2023-06-27 NOTE — Telephone Encounter (Signed)
DOS-07/29/2023  Diane Benjamin [id number] DOUBLE OSTEOTOMY [id number] [place] BUNIONECTOMY [id number]   [employer] EFFECTIVE DATE-08/13/2022  DEDUCTIBLE-$1250.00 WITH REMAINING $0.00 OOP- $4890.00 WITH REMAINING $0.00 COINSURANCE- 20%  SPOKE WITH LATONYA P. FROM [employer] AND SHE STATED THAT PRIOR AUTH IS NOT REQUIRED FOR CPT CODES 96295, [id number] AND [id number].  CALL REF #: LATONYA P. 06/27/2023 @ 9:09 AM EST

## 2023-06-28 ENCOUNTER — Encounter: Payer: Self-pay | Admitting: Podiatry

## 2023-06-28 ENCOUNTER — Telehealth: Payer: Self-pay | Admitting: Podiatry

## 2023-06-28 NOTE — Telephone Encounter (Signed)
Per message from Dr. Allena Katz, sent letter to patient (thru [facility]) stating time off for surgery and recovery will be from 07/29/2023 thru 08/21/2023, returning to work 08/22/2023.  These dates were suggested by the patient and approved by Dr. Allena Katz.  Called [place] @ [phone] to advise of the letter -- LMVM with info .Marland Kitchen...      J. Abbott -- 06/28/2023

## 2023-07-04 ENCOUNTER — Telehealth: Payer: Self-pay | Admitting: Podiatry

## 2023-07-04 NOTE — Telephone Encounter (Signed)
Received call from patient - RE:  letter from Dr. Allena Katz.  See 11/15 notes -- letter produced but patient never found on her 'MyChart' ....  Per patient's request, e-mailed same letter to her -- [email or web address]   .Marland Kitchen...   J. Abbott -- 07/04/2023

## 2023-07-17 ENCOUNTER — Telehealth: Payer: Self-pay | Admitting: Podiatry

## 2023-07-17 NOTE — Telephone Encounter (Signed)
Completed FMLA documents for Diane Benjamin for her employer - ([employer]).   Send copy of the docs to patient via e-mail [email or web address]>  ....   s/w her and advised the same ....   J. Abbott -- 07/17/2023

## 2023-07-20 ENCOUNTER — Ambulatory Visit
Admission: EM | Admit: 2023-07-20 | Discharge: 2023-07-20 | Disposition: A | Discharge status: Home / Self Care | Payer: BC Managed Care – PPO | Attending: Family Medicine | Admitting: Family Medicine

## 2023-07-20 DIAGNOSIS — J0191 Acute recurrent sinusitis, unspecified: Secondary | ICD-10-CM

## 2023-07-20 DIAGNOSIS — J329 Chronic sinusitis, unspecified: Secondary | ICD-10-CM | POA: Diagnosis not present

## 2023-07-20 MED ORDER — PREDNISONE 20 MG PO TABS: ORAL_TABLET | ORAL | 0 refills | Status: DC

## 2023-07-20 MED ORDER — AMOXICILLIN-POT CLAVULANATE 875-125 MG PO TABS: 1.0000 | ORAL_TABLET | Freq: Two times a day (BID) | ORAL | 0 refills | Status: DC

## 2023-07-20 NOTE — ED Provider Notes (Signed)
[facility]  Note:  This document was prepared using Conservation officer, historic buildings and may include unintentional dictation errors.  MRN: [id number] DOB: Dec 14, 1970  Subjective:   Diane Benjamin is a 52 y.o. female presenting for 5-day history of acute onset recurrent sinus pressure, left-sided sinus pain, left-sided maxillary pain, green productive mucus from her sinuses and nasal washes.  Has well-established chronic sinusitis with recurrent sinus infections.  She just had a procedure this past summer that patient feels has not been successful as she is now starting to have sinus issues again.  No respiratory symptoms.  No current facility-administered medications for this encounter.  Current Outpatient Medications:    Mometasone Furoate POWD, 1.2mg /89ml add 10ml of medicine to of saline; irrigate sinuses BID, Disp: , Rfl:    acetaminophen (TYLENOL) 500 MG tablet, Take 500 mg by mouth every 6 (six) hours as needed. (Patient not taking: Reported on 04/18/2023), Disp: , Rfl:    albuterol (VENTOLIN HFA) 108 (90 Base) MCG/ACT inhaler, Inhale 2 puffs into the lungs every 6 (six) hours as needed. (Patient not taking: Reported on 04/18/2023), Disp: , Rfl:    Azelastine HCl 137 MCG/SPRAY SOLN, , Disp: , Rfl:    EPINEPHrine 0.3 mg/0.3 mL IJ SOAJ injection, Inject into the muscle as directed. (Patient not taking: Reported on 04/18/2023), Disp: , Rfl:    fexofenadine (ALLEGRA) 60 MG tablet, Take 60 mg by mouth 2 (two) times daily., Disp: , Rfl:    XHANCE 93 MCG/ACT EXHU, Place 2 sprays into both nostrils 2 (two) times daily. (Patient not taking: Reported on 04/18/2023), Disp: , Rfl:    Allergies  Allergen Reactions   Erythromycin     Facial swelling   Keflex [Cephalexin]     itching    Past Medical History:  Diagnosis Date   Basal cell carcinoma    Melanoma (HCC)    right ankle and left calf.     Past Surgical History:  Procedure Laterality Date   BASAL CELL  CARCINOMA EXCISION     BREAST ENHANCEMENT SURGERY  2009   ETHMOIDECTOMY Bilateral 04/25/2021   Procedure: TOTAL ETHMOIDECTOMY, FRONTAL RECESS EXPLORATION, SPHENOIDOTOMY;  Surgeon: Geanie Logan, MD;  Location: [facility];  Service: ENT;  Laterality: Bilateral;   IMAGE GUIDED SINUS SURGERY N/A 04/25/2021   Procedure: IMAGE GUIDED SINUS SURGERY;  Surgeon: Geanie Logan, MD;  Location: [facility];  Service: ENT;  Laterality: N/A;  NEEDS TRYKER DISK placed disk on OR CHARGE nurse desk 9-7-kp   MAXILLARY ANTROSTOMY Bilateral 04/25/2021   Procedure: ENDOSCOPICMAXILLARY ANTROSTOMY WITH TISSUE;  Surgeon: Geanie Logan, MD;  Location: [facility];  Service: ENT;  Laterality: Bilateral;   RHINOPLASTY  1990    Family History  Problem Relation Age of Onset   Breast cancer Mother 29   Parkinson's disease Father    Stroke Maternal Grandfather     Social History   Tobacco Use   Smoking status: Never   Smokeless tobacco: Never  Vaping Use   Vaping status: Never Used  Substance Use Topics   Alcohol use: Not Currently    Comment: weekly   Drug use: No    ROS   Objective:   Vitals: BP 137/83 (BP Location: Right Arm)   Pulse 63   Temp 97.9 F (36.6 C) (Oral)   Resp 16   LMP 11/30/2017 (LMP Unknown)   SpO2 96%   Physical Exam Constitutional:      General: She is not  in acute distress.    Appearance: Normal appearance. She is well-developed and normal weight. She is not ill-appearing, toxic-appearing or diaphoretic.  HENT:     Head: Normocephalic and atraumatic.     Right Ear: Tympanic membrane, ear canal and external ear normal. No drainage or tenderness. No middle ear effusion. There is no impacted cerumen. Tympanic membrane is not erythematous or bulging.     Left Ear: Tympanic membrane, ear canal and external ear normal. No drainage or tenderness.  No middle ear effusion. There is no impacted cerumen. Tympanic membrane is not erythematous or bulging.      Nose: Congestion and rhinorrhea present.     Comments: Left-sided maxillary sinus tenderness.    Mouth/Throat:     Mouth: Mucous membranes are moist. No oral lesions.     Pharynx: No pharyngeal swelling, oropharyngeal exudate, posterior oropharyngeal erythema or uvula swelling.     Tonsils: No tonsillar exudate or tonsillar abscesses.  Eyes:     General: No scleral icterus.       Right eye: No discharge.        Left eye: No discharge.     Extraocular Movements: Extraocular movements intact.     Right eye: Normal extraocular motion.     Left eye: Normal extraocular motion.     Conjunctiva/sclera: Conjunctivae normal.  Cardiovascular:     Rate and Rhythm: Normal rate.  Pulmonary:     Effort: Pulmonary effort is normal.  Musculoskeletal:     Cervical back: Normal range of motion and neck supple.  Lymphadenopathy:     Cervical: No cervical adenopathy.  Skin:    General: Skin is warm and dry.  Neurological:     General: No focal deficit present.     Mental Status: She is alert and oriented to person, place, and time.  Psychiatric:        Mood and Affect: Mood normal.        Behavior: Behavior normal.     Assessment and Plan :   PDMP not reviewed this encounter.  1. Acute recurrent sinusitis, unspecified location   2. Chronic sinusitis, unspecified location    Recommend Augmentin for recurrent sinusitis and prednisone for chronic sinusitis.  Follow-up with her ENT specialist. Counseled patient on potential for adverse effects with medications prescribed/recommended today, ER and return-to-clinic precautions discussed, patient verbalized understanding.    Wallis Bamberg, [place] 07/20/23 1157

## 2023-07-20 NOTE — Discharge Instructions (Signed)
We will address another recurrent sinus infection with Augmentin. We'll also be using prednisone to help with the sinus inflammation that comes with this. Hold nasal steroids until you are completely better. Use nasal saline rinses.

## 2023-07-20 NOTE — ED Triage Notes (Signed)
Pt c/o sinus pain, pressure, HA and green nasal drainage with nasal washing x 5 days-NAD-steady gait

## 2023-07-29 ENCOUNTER — Other Ambulatory Visit: Payer: Self-pay | Admitting: Podiatry

## 2023-07-29 ENCOUNTER — Telehealth: Payer: Self-pay

## 2023-07-29 DIAGNOSIS — M2011 Hallux valgus (acquired), right foot: Secondary | ICD-10-CM | POA: Diagnosis not present

## 2023-07-29 MED ORDER — OXYCODONE-ACETAMINOPHEN 5-325 MG PO TABS: 1.0000 | ORAL_TABLET | ORAL | 0 refills | Status: DC | PRN

## 2023-07-29 MED ORDER — IBUPROFEN 800 MG PO TABS: 800.0000 mg | ORAL_TABLET | Freq: Four times a day (QID) | ORAL | 1 refills | Status: AC | PRN

## 2023-07-29 NOTE — Telephone Encounter (Signed)
PA request received from covermymeds for Oxycodone/Acetaminophen 5/325mg  tablet. PA submitted through covermymeds and waiting on response.  Ova Freshwater  ([employer]) PA Case ID #: [id number]

## 2023-07-29 NOTE — Telephone Encounter (Signed)
PA was approved. Approved from 07/29/23-08/29/2023

## 2023-08-05 ENCOUNTER — Ambulatory Visit (INDEPENDENT_AMBULATORY_CARE_PROVIDER_SITE_OTHER): Payer: BC Managed Care – PPO | Admitting: Podiatry

## 2023-08-05 DIAGNOSIS — Z9889 Other specified postprocedural states: Secondary | ICD-10-CM

## 2023-08-05 MED ORDER — OXYCODONE-ACETAMINOPHEN 5-325 MG PO TABS: 1.0000 | ORAL_TABLET | ORAL | 0 refills | Status: AC | PRN

## 2023-08-05 NOTE — Progress Notes (Signed)
  Subjective:  Patient ID: Diane Benjamin, female    DOB: 09/17/1970,  MRN: [id number]  No chief complaint on file.   DOS: 07/29/23 Procedure: Right foot bunionectomy  52 y.o. female returns for POV#1. Patient relates doing well and managing pain. Does relate her father is in the hospital and may have to travel. She has also been up on it more than she probably should.   Review of Systems: Negative except as noted in the HPI. Denies N/V/F/Ch.  Past Medical History:  Diagnosis Date   Basal cell carcinoma    Melanoma (HCC)    right ankle and left calf.    Current Outpatient Medications:    acetaminophen (TYLENOL) 500 MG tablet, Take 500 mg by mouth every 6 (six) hours as needed. (Patient not taking: Reported on 04/18/2023), Disp: , Rfl:    albuterol (VENTOLIN HFA) 108 (90 Base) MCG/ACT inhaler, Inhale 2 puffs into the lungs every 6 (six) hours as needed. (Patient not taking: Reported on 04/18/2023), Disp: , Rfl:    amoxicillin-clavulanate (AUGMENTIN) 875-125 MG tablet, Take 1 tablet by mouth 2 (two) times daily., Disp: 20 tablet, Rfl: 0   Azelastine HCl 137 MCG/SPRAY SOLN, , Disp: , Rfl:    EPINEPHrine 0.3 mg/0.3 mL IJ SOAJ injection, Inject into the muscle as directed. (Patient not taking: Reported on 04/18/2023), Disp: , Rfl:    fexofenadine (ALLEGRA) 60 MG tablet, Take 60 mg by mouth 2 (two) times daily., Disp: , Rfl:    ibuprofen (ADVIL) 800 MG tablet, Take 1 tablet (800 mg total) by mouth every 6 (six) hours as needed., Disp: 60 tablet, Rfl: 1   Mometasone Furoate POWD, 1.2mg /22ml add 10ml of medicine to of saline; irrigate sinuses BID, Disp: , Rfl:    oxyCODONE-acetaminophen (PERCOCET) 5-325 MG tablet, Take 1 tablet by mouth every 4 (four) hours as needed for severe pain (pain score 7-10)., Disp: 30 tablet, Rfl: 0   predniSONE (DELTASONE) 20 MG tablet, Take 2 tablets daily with breakfast., Disp: 10 tablet, Rfl: 0   XHANCE 93 MCG/ACT EXHU, Place 2 sprays into both nostrils 2 (two)  times daily. (Patient not taking: Reported on 04/18/2023), Disp: , Rfl:   Social History   Tobacco Use  Smoking Status Never  Smokeless Tobacco Never    Allergies  Allergen Reactions   Erythromycin     Facial swelling   Keflex [Cephalexin]     itching   Objective:  There were no vitals filed for this visit. There is no height or weight on file to calculate BMI. Constitutional Well developed. Well nourished.  Vascular Foot warm and well perfused. Capillary refill normal to all digits.   Neurologic Normal speech. Oriented to person, place, and time. Epicritic sensation to light touch grossly present bilaterally.  Dermatologic Skin healing well without signs of infection. Skin edges well coapted without signs of infection.  Orthopedic: Tenderness to palpation noted about the surgical site.   Radiographs: Hardware intact and toe well aligned  Assessment:   1. Post-operative state    Plan:  Patient was evaluated and treated and all questions answered.  S/p foot surgery right -Progressing as expected post-operatively. -WB Status: NWB  in CAM boot -Sutures: intact. -Medications: Refill of pain medication provided.  -Foot redressed.  Return in 2 weeks with Dr. Allena Katz   No follow-ups on file.

## 2023-08-21 ENCOUNTER — Encounter: Payer: BC Managed Care – PPO | Admitting: Podiatry

## 2023-08-23 ENCOUNTER — Encounter: Payer: Self-pay | Admitting: Podiatry

## 2023-08-23 ENCOUNTER — Ambulatory Visit (INDEPENDENT_AMBULATORY_CARE_PROVIDER_SITE_OTHER): Payer: 59

## 2023-08-23 ENCOUNTER — Ambulatory Visit (INDEPENDENT_AMBULATORY_CARE_PROVIDER_SITE_OTHER): Payer: 59 | Admitting: Podiatry

## 2023-08-23 DIAGNOSIS — M2011 Hallux valgus (acquired), right foot: Secondary | ICD-10-CM

## 2023-08-23 DIAGNOSIS — Z9889 Other specified postprocedural states: Secondary | ICD-10-CM

## 2023-08-23 NOTE — Progress Notes (Signed)
  Subjective:  Patient ID: Diane Benjamin, female    DOB: 02-08-1971,  MRN: [id number]  Chief Complaint  Patient presents with   Routine Post Op    POV #2 DOS 07/29/2023, RIGHT BUNIONECOTOMY WITH PHALANGEAL OSTEOTOMY WITH FIXATION    Its been so sore. I have been in [place]  because my father died so not able to really rest and ice it    DOS: 07/29/23 Procedure: Right foot bunionectomy  53 y.o. female returns for POV#1. Patient relates doing well and managing pain. Does relate her father is in the hospital and may have to travel. She has also been up on it more than she probably should.   Review of Systems: Negative except as noted in the HPI. Denies N/V/F/Ch.  Past Medical History:  Diagnosis Date   Basal cell carcinoma    Melanoma (HCC)    right ankle and left calf.    Current Outpatient Medications:    acetaminophen  (TYLENOL ) 500 MG tablet, Take 500 mg by mouth every 6 (six) hours as needed. (Patient not taking: Reported on 04/18/2023), Disp: , Rfl:    albuterol (VENTOLIN HFA) 108 (90 Base) MCG/ACT inhaler, Inhale 2 puffs into the lungs every 6 (six) hours as needed. (Patient not taking: Reported on 04/18/2023), Disp: , Rfl:    EPINEPHrine  0.3 mg/0.3 mL IJ SOAJ injection, Inject into the muscle as directed. (Patient not taking: Reported on 04/18/2023), Disp: , Rfl:    fexofenadine (ALLEGRA) 60 MG tablet, Take 60 mg by mouth 2 (two) times daily., Disp: , Rfl:    ibuprofen  (ADVIL ) 800 MG tablet, Take 1 tablet (800 mg total) by mouth every 6 (six) hours as needed., Disp: 60 tablet, Rfl: 1   Mometasone Furoate POWD, 1.2mg /39ml add 10ml of medicine to 240ml of saline; irrigate sinuses BID, Disp: , Rfl:   Social History   Tobacco Use  Smoking Status Never  Smokeless Tobacco Never    Allergies  Allergen Reactions   Erythromycin     Facial swelling   Keflex [Cephalexin]     itching   Objective:  There were no vitals filed for this visit. There is no height or weight on file to  calculate BMI. Constitutional Well developed. Well nourished.  Vascular Foot warm and well perfused. Capillary refill normal to all digits.   Neurologic Normal speech. Oriented to person, place, and time. Epicritic sensation to light touch grossly present bilaterally.  Dermatologic Skin completely epithelialized.  No signs of Deis is noted no complication noted.  Good correction of bunion deformity noted.  Orthopedic: No further tenderness to palpation noted about the surgical site.   Radiographs: Hardware intact and toe well aligned  Assessment:   1. Hav (hallux abducto valgus), right   2. Status post right foot surgery   3. Post-operative state    Plan:  Patient was evaluated and treated and all questions answered.  S/p foot surgery right -Stitches were removed completely reepithelialized.  At this time I discussed with her to begin increasing range of motion exercises.  I will see her back for what I think will be the last time and 4 weeks.  Also encouraged him to start slowly start returning to regular shoes she states understanding  Return in 2 weeks with Dr. Tobie   No follow-ups on file.

## 2023-09-13 ENCOUNTER — Telehealth: Payer: 59 | Admitting: Medical

## 2023-09-13 VITALS — Temp 97.0°F | Wt 112.0 lb

## 2023-09-13 DIAGNOSIS — J329 Chronic sinusitis, unspecified: Secondary | ICD-10-CM

## 2023-09-13 DIAGNOSIS — B9789 Other viral agents as the cause of diseases classified elsewhere: Secondary | ICD-10-CM | POA: Diagnosis not present

## 2023-09-13 DIAGNOSIS — R197 Diarrhea, unspecified: Secondary | ICD-10-CM

## 2023-09-13 NOTE — Progress Notes (Signed)
Subjective:     Patient ID: Diane Benjamin, female   DOB: [birth year], 53 y.o.   MRN: [id number]  This visit type was conducted due to national recommendations for restrictions regarding the COVID-19 Pandemic (e.g. social distancing) in an effort to limit this patient's exposure and mitigate transmission in our community.  Due to their co-morbid illnesses, this patient is at least at moderate risk for complications without adequate follow up.  This format is felt to be most appropriate for this patient at this time.    Documentation for virtual audio and video telecommunications through [place] encounter:  The patient was located at home. The provider was located in the office. The patient did consent to this visit and is aware of possible charges through their insurance for this visit.  The other persons participating in this telemedicine service were none. Time spent on call was 20 minutes and in review of previous records 20 minutes total.  This virtual service is not related to other E/M service within previous 7 days.   HPI Chief Complaint  Patient presents with   bad diarrhea    Bad diarrhea x 2 weeks. Off and on. Seen blood yesterday and has had a lot of mucous in it this week which is not normal for her. The stool has been dark but then today was lighter with mucous.    Virtual concern today for diarrhea.  She has had loose stools for the last 2 weeks, very watery, more explosive than she ever has had.  Getting a few to up to 9 loose stools a day.  She saw a little bit of blood today for the first time and mostly nonbloody diarrhea, watery diarrhea without weird colors or foul odor  There is some mucus in the stool.  She is a smoker.  She does drink alcohol somewhat regularly.  She had some belly pain but not so much now just cramping.  No sick contacts.  However she has been out of town in [place] with her parents, her father passed away unfortunately recently.  Her mother  has been in the hospital and she was with her mother all last week while in the hospital.  She is post to fly out to [place] to take her mother to a rehab facility in [place] to help with her recent injury  Diane Benjamin denies any recent antibiotic use  No personal history of inflammatory bowel disease.  She is awaiting her first colonoscopy  She also notes history of chronic sinus problems, prior sinus surgery.  For the last several weeks she just keeps mucus and sinus congestion for some reason.  No recent fever, sore throat, ear pain, severe cough or shortness of breath  No other aggravating or relieving factors. No other complaint.   Past Medical History:  Diagnosis Date   Basal cell carcinoma    Melanoma (HCC)    right ankle and left calf.   Current Outpatient Medications on File Prior to Visit  Medication Sig Dispense Refill   fexofenadine (ALLEGRA) 60 MG tablet Take 60 mg by mouth 2 (two) times daily.     Fexofenadine HCl (MUCINEX ALLERGY PO) Take by mouth.     ibuprofen (ADVIL) 800 MG tablet Take 1 tablet (800 mg total) by mouth every 6 (six) hours as needed. 60 tablet 1   Mometasone Furoate POWD 1.2mg /74ml add 10ml of medicine to of saline; irrigate sinuses BID     acetaminophen (TYLENOL) 500 MG tablet Take 500  mg by mouth every 6 (six) hours as needed. (Patient not taking: Reported on 09/13/2023)     albuterol (VENTOLIN HFA) 108 (90 Base) MCG/ACT inhaler Inhale 2 puffs into the lungs every 6 (six) hours as needed. (Patient not taking: Reported on 09/13/2023)     EPINEPHrine 0.3 mg/0.3 mL IJ SOAJ injection Inject into the muscle as directed. (Patient not taking: Reported on 09/13/2023)     No current facility-administered medications on file prior to visit.     Review of Systems As in subjective    Objective:   Physical Exam Due to coronavirus pandemic stay at home measures, patient visit was virtual and they were not examined in person.   Temp (!) 97 F (36.1  C)   Wt 112 lb (50.8 kg)   LMP 11/30/2017 (LMP Unknown)   BMI 19.84 kg/m   Gen: wd, wn, nad Answers questions appropriately     Assessment:     Encounter Diagnoses  Name Primary?   Diarrhea of presumed infectious origin Yes   Chronic sinusitis, unspecified location        Plan:     We discussed her concerns.  I expressed sympathy for the recent loss of her father.  We discussed possible differential which could be C. difficile, other infection, functional diarrhea or other.  Advised good fluid intake throughout the day.  Cut back on solid food the next day or do a brat diet, bananas, rice, applesauce, toast, soup broth and other bland light foods and liquids for the short-term  Can use Pepto-Bismol as needed once or twice a day  She will come by today to pick up stool kit so we can have her give a sample and turn in for stool testing ASAP.  She has been in a hospital setting recently and so there is the possibility of C. difficile or other infectious etiology  Avoid dehydration.  If much worse symptoms including severe abdominal pain, bloody diarrhea or other severe symptoms in the next few days, then  go to the emergency department  Regarding mucus, advised combo of Mucinex and Sudafed or Mucinex and allergy pill for the next 3 to 5 days.  If still not improving within the next 72 hours we can consider antibiotic for chronic sinus mucus    Diane Benjamin was seen today for bad diarrhea.  Diagnoses and all orders for this visit:  Diarrhea of presumed infectious origin -     Clostridium difficile EIA -     Cancel: Cdiff NAA+O+P+Stool Culture -     GI Profile, Stool, PCR  Chronic sinusitis, unspecified location -     GI Profile, Stool, PCR    F/u pending stool studies

## 2023-09-17 ENCOUNTER — Other Ambulatory Visit: Payer: Self-pay | Admitting: Medical

## 2023-09-17 ENCOUNTER — Other Ambulatory Visit: Payer: Self-pay | Admitting: *Deleted

## 2023-09-17 LAB — GI PROFILE, STOOL, PCR

## 2023-09-17 MED ORDER — VANCOMYCIN 50 MG/ML ORAL SOLUTION: 125.0000 mg | Freq: Four times a day (QID) | ORAL | 0 refills | Status: DC

## 2023-09-17 MED ORDER — VANCOMYCIN HCL 125 MG PO CAPS: 125.0000 mg | ORAL_CAPSULE | Freq: Four times a day (QID) | ORAL | 0 refills | Status: DC

## 2023-09-17 NOTE — Progress Notes (Signed)
Veronica-please call out vancomycin as epic will not let me send it electronically.  He needs to be 125 mg 4 times a day for 10 days so whether this is a liquid or an oral, it needs to be called out   Unfortunately your gastro profile shows that you are positive for C. difficile infection  The recommended treatment is either Dificid or vancomycin.  Dificid is not recommended since you have allergies to erythromycin.  The other option is vancomycin.  We will call this in.  This is recommended to take 125 mg 4 times a day for 10 days.  C. difficile is difficult to treat and many antibiotics do not work.  So I recommend taking the complete round of treatment, use good hygiene with soap and water, disinfect surfaces in your house, disinfect handles for faucets, light switches and surfaces such as countertops.  If anyone else in your house has similar symptoms they should be tested as well.  I would be taking a probiotic daily or eating yogurt daily as well.  I would recommend a recheck in 3 to 4 weeks to make sure you are much improved  Sometimes if symptoms persist we have to get infectious disease involved

## 2023-09-18 LAB — CLOSTRIDIUM DIFFICILE EIA: C difficile Toxins A+B, EIA: POSITIVE — AB

## 2023-09-25 ENCOUNTER — Ambulatory Visit: Payer: 59 | Admitting: Podiatry

## 2023-09-25 ENCOUNTER — Ambulatory Visit (INDEPENDENT_AMBULATORY_CARE_PROVIDER_SITE_OTHER): Payer: 59

## 2023-09-25 DIAGNOSIS — M2011 Hallux valgus (acquired), right foot: Secondary | ICD-10-CM

## 2023-09-25 DIAGNOSIS — Z9889 Other specified postprocedural states: Secondary | ICD-10-CM

## 2023-09-25 NOTE — Progress Notes (Signed)
  Subjective:  Patient ID: Diane Benjamin, female    DOB: [birth year],  MRN: [id number]  Chief Complaint  Patient presents with   Routine Post Op    DOS 07/29/2023, POV #3, DOS 07/29/2023, RIGHT BUNIONECOTOMY WITH PHALANGEAL OSTEOTOMY WITH FIXATION    DOS: 07/29/23 Procedure: Right foot bunionectomy  53 y.o. female returns for POV : She is doing well.  Denies any other acute complaints  Review of Systems: Negative except as noted in the HPI. Denies N/V/F/Ch.  Past Medical History:  Diagnosis Date   Basal cell carcinoma    Melanoma (HCC)    right ankle and left calf.    Current Outpatient Medications:    acetaminophen (TYLENOL) 500 MG tablet, Take 500 mg by mouth every 6 (six) hours as needed. (Patient not taking: Reported on 09/13/2023), Disp: , Rfl:    albuterol (VENTOLIN HFA) 108 (90 Base) MCG/ACT inhaler, Inhale 2 puffs into the lungs every 6 (six) hours as needed. (Patient not taking: Reported on 09/13/2023), Disp: , Rfl:    EPINEPHrine 0.3 mg/0.3 mL IJ SOAJ injection, Inject into the muscle as directed. (Patient not taking: Reported on 09/13/2023), Disp: , Rfl:    fexofenadine (ALLEGRA) 60 MG tablet, Take 60 mg by mouth 2 (two) times daily., Disp: , Rfl:    Fexofenadine HCl (MUCINEX ALLERGY PO), Take by mouth., Disp: , Rfl:    ibuprofen (ADVIL) 800 MG tablet, Take 1 tablet (800 mg total) by mouth every 6 (six) hours as needed., Disp: 60 tablet, Rfl: 1   Mometasone Furoate POWD, 1.2mg /3ml add 10ml of medicine to of saline; irrigate sinuses BID, Disp: , Rfl:    vancomycin (VANCOCIN) 125 MG capsule, Take 1 capsule (125 mg total) by mouth 4 (four) times daily., Disp: 40 capsule, Rfl: 0   vancomycin (VANCOCIN) 50 mg/mL SOLN oral solution, Take 2.5 mLs (125 mg total) by mouth in the morning, at noon, in the evening, and at bedtime., Disp: 150 mL, Rfl: 0  Social History   Tobacco Use  Smoking Status Never  Smokeless Tobacco Never    Allergies  Allergen Reactions    Erythromycin     Facial swelling   Keflex [Cephalexin]     itching   Objective:  There were no vitals filed for this visit. There is no height or weight on file to calculate BMI. Constitutional Well developed. Well nourished.  Vascular Foot warm and well perfused. Capillary refill normal to all digits.   Neurologic Normal speech. Oriented to person, place, and time. Epicritic sensation to light touch grossly present bilaterally.  Dermatologic Skin completely epithelialized.  No signs of Deis is noted no complication noted.  Good correction of bunion deformity noted.  Orthopedic: No further tenderness to palpation noted about the surgical site.   Radiographs: Hardware intact and toe well aligned  Assessment:   1. Hav (hallux abducto valgus), right    Plan:  Patient was evaluated and treated and all questions answered.  S/p foot surgery right -Clinically healed officially discharged from my care if any foot and ankle issues or in the future she will come back and see me.  Encouraged her to continue range of motion exercises limited range of motion noted due to scar tissue.  Return in 2 weeks with Dr. Allena Katz   No follow-ups on file.

## 2023-10-04 ENCOUNTER — Ambulatory Visit: Payer: Self-pay | Admitting: Medical

## 2023-10-04 NOTE — Telephone Encounter (Signed)
 Patient called, left VM to return the call to the office to speak to the NT.    Copied from CRM [id number]. Topic: Clinical - Medication Question >> Oct 04, 2023  5:46 PM Alessandra Bevels wrote: Reason for CRM: Patient is calling to report that on 09/17/23 she was dx with C-diff completed vancomycin (VANCOCIN) 50 mg/mL SOLN oral solution [[id number]] & vancomycin (VANCOCIN) 125 MG capsule [[id number]]. Pt this week felt like she was getting sick and took thera flu. Pt is reporting loose bowls. And wants to be sure that the C-diff is not returning. Please advise

## 2023-10-07 NOTE — Telephone Encounter (Signed)
 Patient called back and states that she finished antibiotic over a week ago and symptoms resolved but then Tuesday of last week, she developed  congestion, mucous is green, she has tried sudafed, having ear pain. She is not having any flu like symptoms, she is just more congested than usually. Do you want to do a virtual with her? Or just treat her

## 2023-10-07 NOTE — Telephone Encounter (Signed)
 Left message for pt to call back

## 2023-10-08 NOTE — Telephone Encounter (Signed)
 Left detailed message for pt to set up virtual visit or she can try mucinex for a couple days to see if that helps and if not then schedule

## 2023-10-09 ENCOUNTER — Telehealth: Payer: 59 | Admitting: Family Medicine

## 2023-10-09 ENCOUNTER — Encounter: Payer: Self-pay | Admitting: Family Medicine

## 2023-10-09 VITALS — Ht 64.0 in | Wt 112.0 lb

## 2023-10-09 DIAGNOSIS — Z9889 Other specified postprocedural states: Secondary | ICD-10-CM

## 2023-10-09 DIAGNOSIS — Z8709 Personal history of other diseases of the respiratory system: Secondary | ICD-10-CM

## 2023-10-09 DIAGNOSIS — J069 Acute upper respiratory infection, unspecified: Secondary | ICD-10-CM

## 2023-10-09 NOTE — Progress Notes (Signed)
   Subjective:    Patient ID: Diane Benjamin, female    DOB: [birth year], 53 y.o.   MRN: [id number]  HPI Documentation for virtual audio and video telecommunications through [facility] encounter:  The patient was located at home. 2 patient identifiers used.  The provider was located in the office. The patient did consent to this visit and is aware of possible charges through their insurance for this visit. The other persons participating in this telemedicine service were none. Time spent on call was 5 minutes and in review of previous records >20 minutes total for counseling and coordination of care.  This virtual service is not related to other E/M service within previous 7 days.  She states that on Tuesday she developed nasal congestion and malaise, rhinorrhea and subsequently had difficulty with headache, purulent nasal drainage.  She has been using sinus rinses and also has a steroid rinse that she has been using.  She has had multiple sinus surgeries and is apparently getting shots for her allergies.  She has been exposed to multiple antibiotics and did develop C. difficile from this.  She states that today she feels slightly better.  Review of Systems     Objective:    Physical Exam Alert and in no distress otherwise not examined       Assessment & Plan:  URI, acute  History of sinus surgery  History of sinusitis I explained that since she is slightly better that I would rather wait a day or 2 especially with her previous sinus surgery and C. difficile diagnosis.  Recommend she continue with nasal rinses including using the steroid.  Hopefully she will continue to improve but if not she is to call back and we will certainly consider putting her on an antibiotic.

## 2023-10-11 ENCOUNTER — Other Ambulatory Visit: Payer: Self-pay | Admitting: Medical

## 2023-10-11 ENCOUNTER — Encounter: Payer: Self-pay | Admitting: Family Medicine

## 2023-10-11 MED ORDER — AMOXICILLIN 875 MG PO TABS: 875.0000 mg | ORAL_TABLET | Freq: Two times a day (BID) | ORAL | 0 refills | Status: DC

## 2023-10-11 MED ORDER — NEOMYCIN-POLYMYXIN-HC 3.5-10000-1 OT SUSP: 3.0000 [drp] | Freq: Three times a day (TID) | OTIC | 0 refills | Status: DC

## 2023-11-19 ENCOUNTER — Other Ambulatory Visit: Payer: Self-pay | Admitting: Podiatry

## 2023-11-19 ENCOUNTER — Encounter: Payer: Self-pay | Admitting: Podiatry

## 2023-11-19 MED ORDER — METHYLPREDNISOLONE 4 MG PO TBPK: ORAL_TABLET | ORAL | 0 refills | Status: DC

## 2023-11-19 MED ORDER — MELOXICAM 15 MG PO TABS
15.0000 mg | ORAL_TABLET | Freq: Every day | ORAL | 0 refills | Status: DC
Start: 2023-11-19 — End: 2024-02-05

## 2023-11-20 ENCOUNTER — Telehealth: Payer: Self-pay | Admitting: Internal Medicine

## 2023-11-20 NOTE — Telephone Encounter (Signed)
 Copied from CRM [id number]. Topic: Clinical - Medication Question >> Nov 19, 2023  4:52 PM Everette C wrote: Reason for CRM: The patient has recently been prescribed meloxicam and methylPREDNISolone by their podiatrist. The patient would like to ensure that there will be no complications related to their past history of issues with clostridioides difficile if they take the medications  Please contact the patient further if possible

## 2024-01-22 ENCOUNTER — Telehealth: Payer: Self-pay | Admitting: Pulmonary Disease

## 2024-01-22 NOTE — Telephone Encounter (Signed)
 Please schedule patient for consult for cough in my earliest available opening. Can use a blocked slot if needed.  Thanks, JD

## 2024-02-05 ENCOUNTER — Encounter: Payer: Self-pay | Admitting: Pulmonary Disease

## 2024-02-05 ENCOUNTER — Ambulatory Visit: Payer: Self-pay | Admitting: Pulmonary Disease

## 2024-02-05 ENCOUNTER — Telehealth: Payer: Self-pay

## 2024-02-05 VITALS — BP 128/82 | HR 67 | Ht 63.0 in | Wt 112.0 lb

## 2024-02-05 DIAGNOSIS — J329 Chronic sinusitis, unspecified: Secondary | ICD-10-CM

## 2024-02-05 DIAGNOSIS — J339 Nasal polyp, unspecified: Secondary | ICD-10-CM

## 2024-02-05 DIAGNOSIS — J45909 Unspecified asthma, uncomplicated: Secondary | ICD-10-CM | POA: Diagnosis not present

## 2024-02-05 LAB — CBC WITH DIFFERENTIAL/PLATELET
Basophils Absolute: 0.1 10*3/uL (ref 0.0–0.1)
Basophils Relative: 1.5 % (ref 0.0–3.0)
Eosinophils Absolute: 0.1 10*3/uL (ref 0.0–0.7)
Eosinophils Relative: 2.8 % (ref 0.0–5.0)
HCT: 41.4 % (ref 36.0–46.0)
Hemoglobin: 13.6 g/dL (ref 12.0–15.0)
Lymphocytes Relative: 32.5 % (ref 12.0–46.0)
Lymphs Abs: 1.2 10*3/uL (ref 0.7–4.0)
MCHC: 32.9 g/dL (ref 30.0–36.0)
MCV: 94.6 fl (ref 78.0–100.0)
Monocytes Absolute: 0.2 10*3/uL (ref 0.1–1.0)
Monocytes Relative: 7 % (ref 3.0–12.0)
Neutro Abs: 2 10*3/uL (ref 1.4–7.7)
Neutrophils Relative %: 56.2 % (ref 43.0–77.0)
Platelets: 211 10*3/uL (ref 150.0–400.0)
RBC: 4.37 Mil/uL (ref 3.87–5.11)
RDW: 13.5 % (ref 11.5–15.5)
WBC: 3.6 10*3/uL — ABNORMAL LOW (ref 4.0–10.5)

## 2024-02-05 LAB — COMPREHENSIVE METABOLIC PANEL WITH GFR
ALT: 36 U/L — ABNORMAL HIGH (ref 0–35)
AST: 34 U/L (ref 0–37)
Albumin: 4.7 g/dL (ref 3.5–5.2)
Alkaline Phosphatase: 44 U/L (ref 39–117)
BUN: 16 mg/dL (ref 6–23)
CO2: 30 meq/L (ref 19–32)
Calcium: 9.8 mg/dL (ref 8.4–10.5)
Chloride: 102 meq/L (ref 96–112)
Creatinine, Ser: 0.75 mg/dL (ref 0.40–1.20)
GFR: 91.47 mL/min (ref >60.00)
Glucose, Bld: 97 mg/dL (ref 70–99)
Potassium: 4.4 meq/L (ref 3.5–5.1)
Sodium: 139 meq/L (ref 135–145)
Total Bilirubin: 0.6 mg/dL (ref 0.2–1.2)
Total Protein: 7.4 g/dL (ref 6.0–8.3)

## 2024-02-05 NOTE — Progress Notes (Signed)
 Synopsis: Referred in June 2025 for Sinus Polyposis  Subjective:   PATIENT ID: Diane Benjamin GENDER: female DOB: 05-06-1971, MRN: [id number]   HPI  Chief Complaint  Patient presents with   Consult    Pt states ent referral sinus,  unresolved issues. Dupexident ?    Diane Benjamin is a 53 year old woman with history of sinus polyposis who is referred to pulmonary clinic for evaluation of dupixent therapy.  She has followed with ENT at [facility] who has recommended she start on Dupixent therapy for nasal/sinus polyposis. Note reviewed from 01/15/24.   She has experienced significant sinus issues for the past three years, with worsening symptoms in 2022. Despite two surgeries and allergy shots, her symptoms have not significantly improved. She is allergic to dogs, cats, mold, and other allergens, and currently receives allergy shots for these.  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 There is increased coughing since her second surgery, and she works in a dusty environment. An air scrubber was recently installed in her home, which has improved her symptoms.  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 She does not smoke but had secondhand smoke exposure from her parents during childhood. There is no family history of lung disease.  She uses mometasone in her sinus rinse and takes Allegra routinely, although she does not notice a significant difference with the allergy shots. She has not been on any biologic or injectable medications other than allergy shots. During the review of symptoms, she reports occasional wheezing when active, increased coughing since her second surgery, and no joint pains or skin rashes. She has no hobbies involving dust or chemical exposures and has no birds at home.  Past Medical History:   Diagnosis Date   Basal cell carcinoma    Melanoma (HCC)    right ankle and left calf.     Family History  Problem Relation Age of Onset   Breast cancer Mother 85   Parkinson's disease Father    Stroke Maternal Grandfather      Social History   Socioeconomic History   Marital status: Divorced    Spouse name: Not on file   Number of children: Not on file   Years of education: Not on file   Highest education level: Bachelor's degree (e.g., BA, AB, BS)  Occupational History   Not on file  Tobacco Use   Smoking status: Never   Smokeless tobacco: Never  Vaping Use   Vaping status: Never Used  Substance and Sexual Activity   Alcohol use: Not Currently    Comment: weekly   Drug use: No   Sexual activity: Not Currently  Other Topics Concern   Not on file  Social History Narrative   Not on file   Social Drivers of Health   Financial Resource Strain: Patient Declined (10/08/2023)   Overall Financial Resource Strain (CARDIA)    Difficulty of Paying Living Expenses: Patient declined  Food Insecurity: Patient Declined (10/08/2023)   Hunger Vital Sign    Worried About Running Out of Food in the Last Year: Patient declined    Ran Out of Food in the Last Year: Patient declined  Transportation Needs: Patient Declined (10/08/2023)   PRAPARE - Administrator, Civil Service (Medical): Patient declined    Lack of Transportation (Non-Medical): Patient declined  Physical Activity: Unknown (10/08/2023)   Exercise Vital Sign    Days of Exercise per Week: Patient declined    Minutes of Exercise per Session: Not on file  Stress: Stress Concern Present (10/08/2023)   [employer] of Occupational Health - Occupational Stress Questionnaire    Feeling of Stress : Very much  Social Connections: Unknown (10/08/2023)   Social Connection and Isolation Panel    Frequency of Communication with Friends and Family: Patient declined    Frequency of Social Gatherings with Friends and  Family: Patient declined    Attends Religious Services: Patient declined    Database administrator or Organizations: Patient declined    Attends Engineer, structural: Not on file    Marital Status: Divorced  Intimate Partner Violence: Not on file     Allergies  Allergen Reactions   Erythromycin     Facial swelling   Keflex [Cephalexin]     itching     Outpatient Medications Prior to Visit  Medication Sig Dispense Refill   acetaminophen  (TYLENOL ) 500 MG tablet Take 500 mg by mouth every 6 (six) hours as needed. (Patient not taking: Reported on 10/09/2023)     albuterol (VENTOLIN HFA) 108 (90 Base) MCG/ACT inhaler Inhale 2 puffs into the lungs every 6 (six) hours as needed. (Patient not taking: Reported on 04/18/2023)     EPINEPHrine  0.3 mg/0.3 mL IJ SOAJ injection Inject into the muscle as directed. (Patient not taking: Reported on 04/18/2023)     fexofenadine (ALLEGRA) 60 MG tablet Take 60 mg by mouth 2 (two) times daily.     Fexofenadine HCl (MUCINEX ALLERGY PO) Take by mouth.     ibuprofen  (ADVIL ) 800 MG tablet Take 1 tablet (800 mg total) by mouth every 6 (six) hours as needed. 60 tablet 1   methylPREDNISolone  (MEDROL  DOSEPAK) 4 MG TBPK tablet Take as directed 21 each 0   Mometasone Furoate POWD 1.2mg /70ml add 10ml of medicine to 240ml of saline; irrigate sinuses BID (Patient not taking: Reported on 10/09/2023)     Phenylephrine-APAP-guaiFENesin (SUDAFED PE HEAD CONGESTION) 5-325-200 MG TABS Take 2 tablets by mouth daily.     vancomycin  (VANCOCIN ) 125 MG capsule Take 1 capsule (125 mg total) by mouth 4 (four) times daily. (Patient not taking: Reported on 10/09/2023) 40 capsule 0   vancomycin  (VANCOCIN ) 50 mg/mL SOLN oral solution Take 2.5 mLs (125 mg total) by mouth in the morning, at noon, in the evening, and at bedtime. (Patient not taking: Reported on 10/09/2023) 150 mL 0   amoxicillin  (AMOXIL ) 875 MG tablet Take 1 tablet (875 mg total) by mouth 2 (two) times daily. 20 tablet 0    meloxicam  (MOBIC ) 15 MG tablet Take 1 tablet (15 mg total) by mouth daily. 30 tablet 0   neomycin -polymyxin-hydrocortisone (CORTISPORIN) 3.5-10000-1 OTIC suspension Place 3 drops into both ears 3 (three) times daily. 10 mL 0   No facility-administered medications prior to visit.    Review of Systems  Constitutional:  Negative for chills, fever, malaise/fatigue and weight loss.  HENT:  Positive for congestion. Negative for sinus pain and sore throat.   Eyes: Negative.   Respiratory:  Positive for cough. Negative for hemoptysis, sputum production, shortness of breath and wheezing.   Cardiovascular:  Negative for chest pain, palpitations, orthopnea, claudication and leg swelling.  Gastrointestinal:  Negative for abdominal pain, heartburn, nausea and vomiting.  Genitourinary: Negative.   Musculoskeletal:  Negative for joint pain and myalgias.  Skin:  Negative for rash.  Neurological:  Negative for weakness.  Endo/Heme/Allergies: Negative.   Psychiatric/Behavioral: Negative.        Objective:   Vitals:   02/05/24 1128  BP: 128/82  Pulse: 67  SpO2: 99%  Weight: 112 lb (50.8 kg)  Height: 5' 3 (1.6 m)     Physical Exam Constitutional:      General: She is not in acute distress.    Appearance: Normal appearance.  Eyes:     General: No scleral icterus.    Conjunctiva/sclera: Conjunctivae normal.  Cardiovascular:     Rate and Rhythm: Normal rate and regular rhythm.  Pulmonary:     Breath sounds: No wheezing, rhonchi or rales.  Musculoskeletal:     Right lower leg: No edema.     Left lower leg: No edema.  Skin:    General: Skin is warm and dry.  Neurological:     General: No focal deficit present.       CBC    Component Value Date/Time   WBC 3.6 (L) 02/05/2024 1204   RBC 4.37 02/05/2024 1204   HGB 13.6 02/05/2024 1204   HGB 12.7 08/28/2017 1236   HCT 41.4 02/05/2024 1204   HCT 39.1 08/28/2017 1236   PLT 211.0 02/05/2024 1204   PLT 247 08/28/2017 1236   MCV  94.6 02/05/2024 1204   MCV 92 08/28/2017 1236   MCH 30.8 11/07/2022 1648   MCHC 32.9 02/05/2024 1204   RDW 13.5 02/05/2024 1204   RDW 13.2 08/28/2017 1236   LYMPHSABS 1.2 02/05/2024 1204   LYMPHSABS 1.5 08/28/2017 1236   MONOABS 0.2 02/05/2024 1204   EOSABS 0.1 02/05/2024 1204   EOSABS 0.1 08/28/2017 1236   BASOSABS 0.1 02/05/2024 1204   BASOSABS 0.0 08/28/2017 1236     Chest imaging:  PFT:     No data to display          Labs:  Path:  Echo:  Heart Catheterization:  Nasal Endoscopy 01/15/24 Findings:  On the right, the mucosa appears mildy edematous in the frontal recess with small amount of opalescent mucus that was suctioned. However, the mucosa in the maxillary posterior ethmoid and sphenoid sinus is flat and healthy. All sinuses are widely patent. On the left, the mucosa appears mildy edematous along the ethmoid skull base. Some thick mucus was suctioned from the frontal recess and also is noted to have some polypoid edema that is decompressed. However, mucosa in the maxillary, sphenoid and frontal sinus is flat and healthy. All sinuses are widely patent.       Assessment & Plan:   Sinusitis with nasal polyps - Plan: CBC with Differential/Platelet, Comp Met (CMET), IgE, IgE, CBC with Differential/Platelet, Comp Met (CMET)  Discussion: Kaylamarie Arena is a 53 year old woman with history of sinus polyposis who is referred to pulmonary clinic for evaluation of dupixent therapy.  Chronic sinusitis with nasal polyps Chronic sinusitis with nasal polyps for three years, worsening since 2022. Previous surgeries and allergy shots ineffective. ENT recommended Dupixent to improve sinus and lung issues. No prior biologic or injectable use. - Initiate Dupixent therapy with first injection in clinic, monitor for immediate reactions. - Perform updated labs: basic chemistry panel, CBC, IgE level before Dupixent. - Consider discontinuing allergy shots after 3-6 months of Dupixent if  significant improvement.  Allergic rhinitis due to animal (cat, dog) dander Allergic rhinitis confirmed by testing, allergies to dogs, cats, mold. Uncertain efficacy of allergy shots. Dupixent expected to manage symptoms. - Discuss with allergy team about discontinuing allergy  shots after 3-6 months of Dupixent if symptoms improve.  Asthma, unspecified Asthma with occasional wheezing, especially during activity. Using albuterol inhaler as needed.  - Continue albuterol inhaler as needed. - Evaluate asthma symptoms after starting Dupixent.  Follow up in 3 months.  Dorn Chill, MD [facility] Office: [phone]   Current Outpatient Medications:    acetaminophen  (TYLENOL ) 500 MG tablet, Take 500 mg by mouth every 6 (six) hours as needed. (Patient not taking: Reported on 10/09/2023), Disp: , Rfl:    albuterol (VENTOLIN HFA) 108 (90 Base) MCG/ACT inhaler, Inhale 2 puffs into the lungs every 6 (six) hours as needed. (Patient not taking: Reported on 04/18/2023), Disp: , Rfl:    EPINEPHrine  0.3 mg/0.3 mL IJ SOAJ injection, Inject into the muscle as directed. (Patient not taking: Reported on 04/18/2023), Disp: , Rfl:    fexofenadine (ALLEGRA) 60 MG tablet, Take 60 mg by mouth 2 (two) times daily., Disp: , Rfl:    Fexofenadine HCl (MUCINEX ALLERGY PO), Take by mouth., Disp: , Rfl:    ibuprofen  (ADVIL ) 800 MG tablet, Take 1 tablet (800 mg total) by mouth every 6 (six) hours as needed., Disp: 60 tablet, Rfl: 1   methylPREDNISolone  (MEDROL  DOSEPAK) 4 MG TBPK tablet, Take as directed, Disp: 21 each, Rfl: 0   Mometasone Furoate POWD, 1.2mg /37ml add 10ml of medicine to 240ml of saline; irrigate sinuses BID (Patient not taking: Reported on 10/09/2023), Disp: , Rfl:    Phenylephrine-APAP-guaiFENesin (SUDAFED PE HEAD CONGESTION) 5-325-200 MG TABS, Take 2 tablets by mouth daily., Disp: , Rfl:    vancomycin  (VANCOCIN ) 125 MG capsule, Take 1 capsule (125 mg total) by mouth 4 (four) times  daily. (Patient not taking: Reported on 10/09/2023), Disp: 40 capsule, Rfl: 0   vancomycin  (VANCOCIN ) 50 mg/mL SOLN oral solution, Take 2.5 mLs (125 mg total) by mouth in the morning, at noon, in the evening, and at bedtime. (Patient not taking: Reported on 10/09/2023), Disp: 150 mL, Rfl: 0

## 2024-02-05 NOTE — Patient Instructions (Addendum)
 We will work on getting you approved for Dupixent therapy  Continue albuterol inhaler as needed for shortness of breath and wheezing  We will check labs today  Follow up in 3 months

## 2024-02-05 NOTE — Telephone Encounter (Signed)
 Dupixent/ Rhinosinusitis paperwork handed into pharmacy team

## 2024-02-06 ENCOUNTER — Telehealth: Payer: Self-pay

## 2024-02-06 LAB — IGE: IgE (Immunoglobulin E), Serum: 2 kU/L (ref <=114)

## 2024-02-06 NOTE — Telephone Encounter (Signed)
 Received Dupixent new start paperwork for chronic rhinosinusitis with nasal polyps  Dx: J33.9 Dose: 300mg  subcut every 14 days  PA pending OV note to be signed

## 2024-02-12 ENCOUNTER — Telehealth (HOSPITAL_BASED_OUTPATIENT_CLINIC_OR_DEPARTMENT_OTHER): Payer: Self-pay | Admitting: *Deleted

## 2024-02-12 ENCOUNTER — Encounter: Payer: Self-pay | Admitting: Pulmonary Disease

## 2024-02-12 NOTE — Telephone Encounter (Signed)
 Returning patient call re: lab results.  Provider has not resulted yet. LMTCB

## 2024-02-13 ENCOUNTER — Ambulatory Visit: Payer: Self-pay | Admitting: Pulmonary Disease

## 2024-02-13 NOTE — Telephone Encounter (Signed)
 Returned call to patient advised MD has not reviewed labs yet. There was no major concern that needed addressing urgently. Informed she would get a call once labs reviewed and next steps re: Dupixent.

## 2024-02-19 NOTE — Telephone Encounter (Signed)
 Submitted a Prior Authorization request to [facility] for DUPIXENT via CoverMyMeds. Will update once we receive a response.  Key: [id number]

## 2024-02-21 ENCOUNTER — Other Ambulatory Visit (HOSPITAL_COMMUNITY): Payer: Self-pay

## 2024-02-21 NOTE — Telephone Encounter (Signed)
 Received notification from [facility] regarding a prior authorization for DUPIXENT. Authorization has been APPROVED from 02/19/2024 to 08/21/2024. Approval letter sent to scan center.  Unable to run test claim because patient must fill through [facility]: [phone]  Authorization # [id number]  Patient will need to be enrolled into Dupixent copay card and then scheduled for Dupixent new start visit  Sherry Pennant, PharmD, MPH, BCPS, CPP Clinical Pharmacist (Rheumatology and Pulmonology)

## 2024-02-25 ENCOUNTER — Encounter: Payer: Self-pay | Admitting: Podiatry

## 2024-02-25 NOTE — Telephone Encounter (Addendum)
 Activated Dupixent  copay card. Copay information listed below:  RxBIN: [id number] RxGRP: [id number] Issuer: LOYALTy ID: [id number]  Deleta Colt PharmD Candidate 2026  [facility]

## 2024-02-28 NOTE — Progress Notes (Signed)
 HPI Patient presents today to [facility] to see pharmacy team for Dupixent  new start.  Past medical history includes sinus polyposis (2022) and chronic sinusitis. Currently taking Mometasone Furoate powder 1.2mg /80mL. Irrigate sinuses BID.   OBJECTIVE Allergies  Allergen Reactions   Erythromycin     Facial swelling   Keflex [Cephalexin]     itching    Outpatient Encounter Medications as of 03/02/2024  Medication Sig Note   acetaminophen  (TYLENOL ) 500 MG tablet Take 500 mg by mouth every 6 (six) hours as needed. (Patient not taking: Reported on 10/09/2023) 04/18/2023: As needed   albuterol (VENTOLIN HFA) 108 (90 Base) MCG/ACT inhaler Inhale 2 puffs into the lungs every 6 (six) hours as needed. (Patient not taking: Reported on 04/18/2023) 04/18/2023: As needed   EPINEPHrine  0.3 mg/0.3 mL IJ SOAJ injection Inject into the muscle as directed. (Patient not taking: Reported on 04/18/2023) 04/18/2023: As needed   fexofenadine (ALLEGRA) 60 MG tablet Take 60 mg by mouth 2 (two) times daily.    Fexofenadine HCl (MUCINEX ALLERGY PO) Take by mouth.    ibuprofen  (ADVIL ) 800 MG tablet Take 1 tablet (800 mg total) by mouth every 6 (six) hours as needed.    methylPREDNISolone  (MEDROL  DOSEPAK) 4 MG TBPK tablet Take as directed    Mometasone Furoate POWD 1.2mg /28ml add 10ml of medicine to 240ml of saline; irrigate sinuses BID (Patient not taking: Reported on 10/09/2023)    Phenylephrine-APAP-guaiFENesin (SUDAFED PE HEAD CONGESTION) 5-325-200 MG TABS Take 2 tablets by mouth daily.    vancomycin  (VANCOCIN ) 125 MG capsule Take 1 capsule (125 mg total) by mouth 4 (four) times daily. (Patient not taking: Reported on 10/09/2023)    vancomycin  (VANCOCIN ) 50 mg/mL SOLN oral solution Take 2.5 mLs (125 mg total) by mouth in the morning, at noon, in the evening, and at bedtime. (Patient not taking: Reported on 10/09/2023)    No facility-administered encounter medications on file as of 03/02/2024.     Immunization  History  Administered Date(s) Administered   Tdap 03/29/2015     PFTs     No data to display           Eosinophils Most recent blood eosinophil count was 100 taken on 02/05/2024.   IgE: 2 on 02/05/2024   Assessment   Biologics training for dupilumab  (Dupixent )  Goals of therapy: Mechanism: human monoclonal IgG4 antibody that inhibits interleukin-4 and interleukin-13 cytokine-induced responses, including release of proinflammatory cytokines, chemokines, and IgE Reviewed that Dupixent  is add-on medication and patient must continue maintenance regimen. Response to therapy: may take 4 months to determine efficacy. Discussed that patients generally feel improvement sooner than 4 months.  Side effects: injection site reaction (6-18%), antibody development (5-16%), ophthalmic conjunctivitis (2-16%), transient blood eosinophilia (1-2%)  Dose: 600mg  at Week 0 (administered today in clinic) followed by 300mg  every 14 days thereafter  Administration/Storage:  Reviewed administration sites of thigh or abdomen (at least 2-3 inches away from abdomen). Reviewed the upper arm is only appropriate if caregiver is administering injection  Do not shake pen/syringe as this could lead to product foaming or precipitation. Do not use if solution is discolored or contains particulate matter or if window on prefilled pen is yellow (indicates pen has been used).  Reviewed storage of medication in refrigerator. Reviewed that Dupixent  can be stored at room temperature in unopened carton for up to 14 days.  Access: Approval of Dupixent  through: insurance Patient enrolled into copay card program to help with copay assistance.  Patient self-administered Dupixent   300mg /32ml x 2 (total dose 600mg ) in right upper thigh and left upper thigh using sample Dupixent  300mg /73mL autoinjector pen NDC: [phone] Lot: [id number] Expiration: 08/12/2025  Patient monitored for 30 minutes for adverse reaction.  Patient  tolerated injections well.  Injection site noted. Patient denies itchiness and irritation at injection., No swelling or redness noted., and Reviewed injection site reaction management with patient verbally and printed information for review in AVS  Medication Reconciliation  A drug regimen assessment was performed, including review of allergies, interactions, disease-state management, dosing and immunization history. Medications were reviewed with the patient, including name, instructions, indication, goals of therapy, potential side effects, importance of adherence, and safe use.  Drug interaction(s): none   PLAN Continue Dupixent  300mg  every 14 days.  Next dose is due 03/16/2024 and every 14 days thereafter. [facility]: [phone].  Patient provided with pharmacy phone number and advised to call later this week to schedule shipment to home. Patient provided with copay card information to provide to pharmacy if quoted copay exceeds $5 per month. Continue maintenance sinusitis regimen of: Mometasone Furoate powder 1.2mg /46mL. Irrigate sinuses BID  All questions encouraged and answered.  Instructed patient to reach out with any further questions or concerns.  Thank you for allowing pharmacy to participate in this patient's care.  This appointment required 45 minutes of patient care (this includes precharting, chart review, review of results, face-to-face care, etc.).

## 2024-03-02 ENCOUNTER — Encounter: Payer: Self-pay | Admitting: Pharmacist

## 2024-03-02 ENCOUNTER — Ambulatory Visit (INDEPENDENT_AMBULATORY_CARE_PROVIDER_SITE_OTHER): Admitting: Pharmacist

## 2024-03-02 DIAGNOSIS — J339 Nasal polyp, unspecified: Secondary | ICD-10-CM

## 2024-03-02 DIAGNOSIS — J329 Chronic sinusitis, unspecified: Secondary | ICD-10-CM | POA: Diagnosis not present

## 2024-03-02 DIAGNOSIS — Z7189 Other specified counseling: Secondary | ICD-10-CM

## 2024-03-02 MED ORDER — DUPIXENT 300 MG/2ML ~~LOC~~ SOAJ: 300.0000 mg | SUBCUTANEOUS | 1 refills | Status: DC

## 2024-03-02 NOTE — Patient Instructions (Addendum)
 Your next Dupixent  dose is due on 03/16/2024, 03/30/2024, and every 14 days thereafter  START Dupixent  300mg  every 14 days.Inject Dupixent  into the skin every 14 days.  Your prescription [facility] shipped from [facility]. Their phone number is [phone] Please call to schedule shipment and confirm address. They will mail your medication to your home.  Your copay should be affordable. If you call the pharmacy and it is not affordable, please double-check that they are billing through your copay card as secondary coverage. That copay card information is: ID: [id number] BIN: [id number] PCN: [id number] Group Number: [id number]  You will need to be seen by your provider in 3 to 4 months to assess how Dupixent  is working for you. Please ensure you have a follow-up appointment scheduled in 3 months. Call our clinic if you need to make this appointment.  Stay up to date on all routine vaccines: influenza, pneumonia, COVID19, Shingles  How to manage an injection site reaction: Remember the 5 C's: COUNTER - leave on the counter at least 30 minutes but up to overnight to bring medication to room temperature. This may help prevent stinging COLD - place something cold (like an ice gel pack or cold water bottle) on the injection site just before cleansing with alcohol. This may help reduce pain CLARITIN - use Claritin (generic name is loratadine) for the first two weeks of treatment or the day of, the day before, and the day after injecting. This will help to minimize injection site reactions CORTISONE CREAM - apply if injection site is irritated and itching CALL ME - if injection site reaction is bigger than the size of your fist, looks infected, blisters, or if you develop hives

## 2024-03-04 NOTE — Addendum Note (Signed)
 Addended by: DAYNE SHERRY RAMAN on: 03/04/2024 10:14 AM   Modules accepted: Level of Service

## 2024-05-07 ENCOUNTER — Other Ambulatory Visit (INDEPENDENT_AMBULATORY_CARE_PROVIDER_SITE_OTHER)

## 2024-05-07 ENCOUNTER — Encounter: Payer: Self-pay | Admitting: Pulmonary Disease

## 2024-05-07 ENCOUNTER — Ambulatory Visit: Admitting: Pulmonary Disease

## 2024-05-07 VITALS — BP 117/80 | HR 72 | Ht 64.0 in | Wt 110.4 lb

## 2024-05-07 DIAGNOSIS — M7918 Myalgia, other site: Secondary | ICD-10-CM | POA: Diagnosis not present

## 2024-05-07 DIAGNOSIS — J329 Chronic sinusitis, unspecified: Secondary | ICD-10-CM

## 2024-05-07 DIAGNOSIS — J4520 Mild intermittent asthma, uncomplicated: Secondary | ICD-10-CM | POA: Diagnosis not present

## 2024-05-07 DIAGNOSIS — J339 Nasal polyp, unspecified: Secondary | ICD-10-CM

## 2024-05-07 DIAGNOSIS — J3081 Allergic rhinitis due to animal (cat) (dog) hair and dander: Secondary | ICD-10-CM

## 2024-05-07 LAB — CBC WITH DIFFERENTIAL/PLATELET
Basophils Absolute: 0 K/uL (ref 0.0–0.1)
Basophils Relative: 0.8 % (ref 0.0–3.0)
Eosinophils Absolute: 0 K/uL (ref 0.0–0.7)
Eosinophils Relative: 0.9 % (ref 0.0–5.0)
HCT: 41.1 % (ref 36.0–46.0)
Hemoglobin: 13.6 g/dL (ref 12.0–15.0)
Lymphocytes Relative: 33.8 % (ref 12.0–46.0)
Lymphs Abs: 1.5 K/uL (ref 0.7–4.0)
MCHC: 33 g/dL (ref 30.0–36.0)
MCV: 93.9 fl (ref 78.0–100.0)
Monocytes Absolute: 0.3 K/uL (ref 0.1–1.0)
Monocytes Relative: 7.4 % (ref 3.0–12.0)
Neutro Abs: 2.5 K/uL (ref 1.4–7.7)
Neutrophils Relative %: 57.1 % (ref 43.0–77.0)
Platelets: 221 K/uL (ref 150.0–400.0)
RBC: 4.38 Mil/uL (ref 3.87–5.11)
RDW: 12.5 % (ref 11.5–15.5)
WBC: 4.3 K/uL (ref 4.0–10.5)

## 2024-05-07 NOTE — Progress Notes (Unsigned)
 Synopsis: Referred in June 2025 for Sinus Polyposis  Subjective:   PATIENT ID: Diane Benjamin GENDER: female DOB: January 31, 1971, MRN: [id number]   HPI  Diane Benjamin is a 53 year old woman with history of sinus polyposis who returns to pulmonary clinic for sinusitis with polyposis and mild intermittent asthma.  Initial OV 02/05/24 She has followed with ENT at [facility] who has recommended she start on Dupixent  therapy for nasal/sinus polyposis. Note reviewed from 01/15/24.   She has experienced significant sinus issues for the past three years, with worsening symptoms in 2022. Despite two surgeries and allergy shots, her symptoms have not significantly improved. She is allergic to dogs, cats, mold, and other allergens, and currently receives allergy shots for these.  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 There is increased coughing since her second surgery, and she works in a dusty environment. An air scrubber was recently installed in her home, which has improved her symptoms.  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 She does not smoke but had secondhand smoke exposure from her parents during childhood. There is no family history of lung disease.  She uses mometasone in her sinus rinse and takes Allegra routinely, although she does not notice a significant difference with the allergy shots. She has not been on any biologic or injectable medications other than allergy shots. During the review of symptoms, she reports occasional wheezing when active, increased coughing since her second surgery, and no joint pains or skin rashes. She has no hobbies involving dust or chemical exposures and has no birds at home.  OV 05/07/24 She experiences significant discomfort in her left arm, described as heaviness with bulging veins,  particularly after showering and upon waking. The left arm is more painful with a burning sensation, and there is some numbness and tingling. The discomfort improves throughout the day.  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 She experienced severe headaches and disorientation, feeling somewhat better after remediation steps were taken. She has sinus issues and is on Dupixent , which has improved her condition. She uses sinus rinses with a compounded antibiotic due to her work environment. At home, she installed an air scrubber and ensured her crawl space is dry. At work, she moved offices to avoid mold exposure and uses an Product manager.  Her current medications include Dupixent  injections, allergy shots, and sinus rinses with a compounded antibiotic. She has not been using albuterol recently. She is concerned about her liver enzymes and white blood cell count, which were slightly abnormal in recent tests. She notes bubbly urine, raising concerns about potential kidney issues. No fever, heart racing, wheezing, significant fatigue, or pain in the thighs or legs.   Past Medical History:  Diagnosis Date   Basal cell carcinoma    Melanoma (HCC)    right ankle and left calf.     Family History  Problem Relation Age of Onset   Breast cancer Mother 70   Parkinson's disease Father    Stroke Maternal Grandfather      Social History   Socioeconomic History   Marital status: Divorced    Spouse name: Not on file   Number of children: Not on file   Years of education: Not on file   Highest education level: Bachelor's degree (  e.g., BA, AB, BS)  Occupational History   Not on file  Tobacco Use   Smoking status: Never   Smokeless tobacco: Never  Vaping Use   Vaping status: Never Used  Substance and Sexual Activity   Alcohol use: Not Currently    Comment: weekly   Drug use: No   Sexual activity: Not Currently  Other Topics Concern   Not  on file  Social History Narrative   Not on file   Social Drivers of Health   Financial Resource Strain: Patient Declined (10/08/2023)   Overall Financial Resource Strain (CARDIA)    Difficulty of Paying Living Expenses: Patient declined  Food Insecurity: Patient Declined (10/08/2023)   Hunger Vital Sign    Worried About Running Out of Food in the Last Year: Patient declined    Ran Out of Food in the Last Year: Patient declined  Transportation Needs: Patient Declined (10/08/2023)   PRAPARE - Administrator, Civil Service (Medical): Patient declined    Lack of Transportation (Non-Medical): Patient declined  Physical Activity: Unknown (10/08/2023)   Exercise Vital Sign    Days of Exercise per Week: Patient declined    Minutes of Exercise per Session: Not on file  Stress: Stress Concern Present (10/08/2023)   [employer] of Occupational Health - Occupational Stress Questionnaire    Feeling of Stress : Very much  Social Connections: Unknown (10/08/2023)   Social Connection and Isolation Panel    Frequency of Communication with Friends and Family: Patient declined    Frequency of Social Gatherings with Friends and Family: Patient declined    Attends Religious Services: Patient declined    Database administrator or Organizations: Patient declined    Attends Engineer, structural: Not on file    Marital Status: Divorced  Intimate Partner Violence: Not on file     Allergies  Allergen Reactions   Erythromycin     Facial swelling   Keflex [Cephalexin]     itching     Outpatient Medications Prior to Visit  Medication Sig Dispense Refill   acetaminophen  (TYLENOL ) 500 MG tablet Take 500 mg by mouth every 6 (six) hours as needed.     albuterol (VENTOLIN HFA) 108 (90 Base) MCG/ACT inhaler Inhale 2 puffs into the lungs every 6 (six) hours as needed.     Dupilumab  (DUPIXENT ) 300 MG/2ML SOAJ Inject 300 mg into the skin every 14 (fourteen) days. **loading dose completed  in clinic on 03/02/2024** 12 mL 1   EPINEPHrine  0.3 mg/0.3 mL IJ SOAJ injection Inject into the muscle as directed.     fexofenadine (ALLEGRA) 60 MG tablet Take 60 mg by mouth 2 (two) times daily.     Fexofenadine HCl (MUCINEX ALLERGY PO) Take by mouth.     ibuprofen  (ADVIL ) 800 MG tablet Take 1 tablet (800 mg total) by mouth every 6 (six) hours as needed. 60 tablet 1   Mometasone Furoate POWD 1.2mg /56ml add 10ml of medicine to 240ml of saline; irrigate sinuses BID     Phenylephrine-APAP-guaiFENesin (SUDAFED PE HEAD CONGESTION) 5-325-200 MG TABS Take 2 tablets by mouth daily.     methylPREDNISolone  (MEDROL  DOSEPAK) 4 MG TBPK tablet Take as directed 21 each 0   vancomycin  (VANCOCIN ) 125 MG capsule Take 1 capsule (125 mg total) by mouth 4 (four) times daily. (Patient not taking: Reported on 10/09/2023) 40 capsule 0   vancomycin  (VANCOCIN ) 50 mg/mL SOLN oral solution Take 2.5 mLs (125 mg total) by mouth in the morning, at  noon, in the evening, and at bedtime. (Patient not taking: Reported on 10/09/2023) 150 mL 0   No facility-administered medications prior to visit.    Review of Systems  Constitutional:  Negative for chills, fever, malaise/fatigue and weight loss.  HENT:  Positive for congestion. Negative for sinus pain and sore throat.   Eyes: Negative.   Respiratory:  Negative for cough, hemoptysis, sputum production and shortness of breath.   Cardiovascular:  Negative for chest pain, palpitations, orthopnea, claudication and leg swelling.  Gastrointestinal:  Negative for abdominal pain, heartburn, nausea and vomiting.  Genitourinary: Negative.   Musculoskeletal:  Positive for myalgias (left > right arm/shoulder). Negative for joint pain.  Skin:  Negative for rash.  Neurological:  Negative for weakness.  Endo/Heme/Allergies: Negative.   Psychiatric/Behavioral: Negative.     Objective:   Vitals:   05/07/24 1555  BP: 117/80  Pulse: 72  SpO2: 95%  Weight: 110 lb 6.4 oz (50.1 kg)  Height:  5' 4 (1.626 m)   Physical Exam Constitutional:      General: She is not in acute distress.    Appearance: Normal appearance.  Eyes:     General: No scleral icterus.    Conjunctiva/sclera: Conjunctivae normal.  Cardiovascular:     Rate and Rhythm: Normal rate and regular rhythm.  Pulmonary:     Breath sounds: No wheezing, rhonchi or rales.  Musculoskeletal:     Right lower leg: No edema.     Left lower leg: No edema.  Skin:    General: Skin is warm and dry.  Neurological:     General: No focal deficit present.       CBC    Component Value Date/Time   WBC 3.6 (L) 02/05/2024 1204   RBC 4.37 02/05/2024 1204   HGB 13.6 02/05/2024 1204   HGB 12.7 08/28/2017 1236   HCT 41.4 02/05/2024 1204   HCT 39.1 08/28/2017 1236   PLT 211.0 02/05/2024 1204   PLT 247 08/28/2017 1236   MCV 94.6 02/05/2024 1204   MCV 92 08/28/2017 1236   MCH 30.8 11/07/2022 1648   MCHC 32.9 02/05/2024 1204   RDW 13.5 02/05/2024 1204   RDW 13.2 08/28/2017 1236   LYMPHSABS 1.2 02/05/2024 1204   LYMPHSABS 1.5 08/28/2017 1236   MONOABS 0.2 02/05/2024 1204   EOSABS 0.1 02/05/2024 1204   EOSABS 0.1 08/28/2017 1236   BASOSABS 0.1 02/05/2024 1204   BASOSABS 0.0 08/28/2017 1236     Chest imaging:  PFT:     No data to display          Labs:  Path:  Echo:  Heart Catheterization:  Nasal Endoscopy 01/15/24 Findings:  On the right, the mucosa appears mildy edematous in the frontal recess with small amount of opalescent mucus that was suctioned. However, the mucosa in the maxillary posterior ethmoid and sphenoid sinus is flat and healthy. All sinuses are widely patent. On the left, the mucosa appears mildy edematous along the ethmoid skull base. Some thick mucus was suctioned from the frontal recess and also is noted to have some polypoid edema that is decompressed. However, mucosa in the maxillary, sphenoid and frontal sinus is flat and healthy. All sinuses are widely patent.       Assessment &  Plan:   Sinusitis with nasal polyps - Plan: CK (Creatine Kinase), Aldolase, CBC with Differential/Platelet, Comp Met (CMET)  Muscle ache of extremity - Plan: EKG 12-Lead  Mild intermittent asthma without complication  Discussion: Diane Benjamin is a 53  year old woman with history of sinus polyposis who returns to pulmonary clinic for sinusitis with polyposis and mild intermittent asthma.  Chronic sinusitis with nasal polyps - Dupixent  therapy started 02/2024 with noted improvement in her sinus inflammation per ENT.  - Repeat CBC and CMP today  Allergic rhinitis due to animal (cat, dog) dander - Discuss with allergy team about discontinuing allergy shots after 3-6 months of Dupixent  if symptoms improve.  Asthma, unspecified Asthma with occasional wheezing, especially during activity and exposures at work. Using albuterol inhaler as needed.  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  Mild Elevation in LFT - repeat CMP  Left Upper Extremity Pain - EKG without ischemic changes - check Aldolase and CK - differential includes entrapment syndrome, may need further workup by PCP including CT vs MRI imaging  Follow up in 4 months.  Dorn Chill, MD [facility] Office: [phone]   Current Outpatient Medications:    acetaminophen  (TYLENOL ) 500 MG tablet, Take 500 mg by mouth every 6 (six) hours as needed., Disp: , Rfl:    albuterol (VENTOLIN HFA) 108 (90 Base) MCG/ACT inhaler, Inhale 2 puffs into the lungs every 6 (six) hours as needed., Disp: , Rfl:    Dupilumab  (DUPIXENT ) 300 MG/2ML SOAJ, Inject 300 mg into the skin every 14 (fourteen) days. **loading dose completed in clinic on 03/02/2024**, Disp: 12 mL, Rfl: 1   EPINEPHrine  0.3 mg/0.3 mL IJ SOAJ injection, Inject into the muscle as directed.,  Disp: , Rfl:    fexofenadine (ALLEGRA) 60 MG tablet, Take 60 mg by mouth 2 (two) times daily., Disp: , Rfl:    Fexofenadine HCl (MUCINEX ALLERGY PO), Take by mouth., Disp: , Rfl:    ibuprofen  (ADVIL ) 800 MG tablet, Take 1 tablet (800 mg total) by mouth every 6 (six) hours as needed., Disp: 60 tablet, Rfl: 1   Mometasone Furoate POWD, 1.2mg /53ml add 10ml of medicine to 240ml of saline; irrigate sinuses BID, Disp: , Rfl:    Phenylephrine-APAP-guaiFENesin (SUDAFED PE HEAD CONGESTION) 5-325-200 MG TABS, Take 2 tablets by mouth daily., Disp: , Rfl:    methylPREDNISolone  (MEDROL  DOSEPAK) 4 MG TBPK tablet, Take as directed, Disp: 21 each, Rfl: 0   vancomycin  (VANCOCIN ) 125 MG capsule, Take 1 capsule (125 mg total) by mouth 4 (four) times daily. (Patient not taking: Reported on 10/09/2023), Disp: 40 capsule, Rfl: 0   vancomycin  (VANCOCIN ) 50 mg/mL SOLN oral solution, Take 2.5 mLs (125 mg total) by mouth in the morning, at noon, in the evening, and at bedtime. (Patient not taking: Reported on 10/09/2023), Disp: 150 mL, Rfl: 0

## 2024-05-07 NOTE — Patient Instructions (Addendum)
 Continue dupixent  injections  We will check labs and EKG today   Continue albuterol inhaler as needed  Follow up in 4 months

## 2024-05-08 ENCOUNTER — Telehealth: Payer: Self-pay

## 2024-05-08 ENCOUNTER — Ambulatory Visit: Payer: Self-pay | Admitting: Pulmonary Disease

## 2024-05-08 LAB — COMPREHENSIVE METABOLIC PANEL WITH GFR
ALT: 20 U/L (ref 0–35)
AST: 21 U/L (ref 0–37)
Albumin: 5 g/dL (ref 3.5–5.2)
Alkaline Phosphatase: 45 U/L (ref 39–117)
BUN: 14 mg/dL (ref 6–23)
CO2: 28 meq/L (ref 19–32)
Calcium: 9.8 mg/dL (ref 8.4–10.5)
Chloride: 102 meq/L (ref 96–112)
Creatinine, Ser: 0.79 mg/dL (ref 0.40–1.20)
GFR: 85.79 mL/min (ref >60.00)
Glucose, Bld: 102 mg/dL — ABNORMAL HIGH (ref 70–99)
Potassium: 4.2 meq/L (ref 3.5–5.1)
Sodium: 139 meq/L (ref 135–145)
Total Bilirubin: 0.4 mg/dL (ref 0.2–1.2)
Total Protein: 7.5 g/dL (ref 6.0–8.3)

## 2024-05-08 LAB — CK: Total CK: 121 U/L (ref 17–177)

## 2024-05-08 NOTE — Telephone Encounter (Signed)
 Received staff message from Dr. Kara regarding patient needing clarification on appropriate Dupixent  injection site.  Patient received a call from [facility] on reviewing her injection technique. She is injecting the dupixent  on the lateral side of her thigh as instructed on her initial medication visit, rather than the anterior side of her thigh instructed by the [facility] team.   Called patient to discuss. Label information does not distinguish between lateral versus anterior area of the thigh - choosing to inject lateral versus anterior of thigh would not meaningfully impact absorption of Dupixent . Either anterior or lateral thigh injection site is appropriate.  Patient verbalizes understanding.   Diane Benjamin, PharmD, BCPS Clinical Pharmacist  [facility]

## 2024-05-11 LAB — ALDOLASE: Aldolase: 3.5 U/L (ref <=8.1)

## 2024-05-19 ENCOUNTER — Other Ambulatory Visit: Payer: Self-pay

## 2024-05-19 DIAGNOSIS — M7918 Myalgia, other site: Secondary | ICD-10-CM

## 2024-07-27 ENCOUNTER — Telehealth: Payer: Self-pay

## 2024-07-27 NOTE — Telephone Encounter (Signed)
 Received fax from [facility] with pa form for Dupixent . Submitted a Prior Authorization request to [facility] for DUPIXENT  via CoverMyMeds. Will update once we receive a response.  Key: [id number]

## 2024-07-28 NOTE — Telephone Encounter (Signed)
 Received notification from [facility] regarding a prior authorization for DUPIXENT . Authorization has been APPROVED from 07/27/24 to 07/27/25. Approval letter sent to scan center.  Patient must continue to fill through [facility]: [phone]  Authorization # [id number] Phone # [phone]

## 2024-08-04 ENCOUNTER — Encounter: Payer: Self-pay | Admitting: Pulmonary Disease

## 2024-08-04 ENCOUNTER — Telehealth: Payer: Self-pay

## 2024-08-04 ENCOUNTER — Other Ambulatory Visit: Payer: Self-pay | Admitting: Pulmonary Disease

## 2024-08-04 DIAGNOSIS — J329 Chronic sinusitis, unspecified: Secondary | ICD-10-CM

## 2024-08-04 DIAGNOSIS — J4520 Mild intermittent asthma, uncomplicated: Secondary | ICD-10-CM

## 2024-08-04 NOTE — Telephone Encounter (Signed)
 Refill sent for DUPIXENT  to [facility]: [phone]  Dose: 300mg  Diane Benjamin every 14 days  Last OV: 05/07/24 Provider: Dr. Kara  Next OV: due Jan 2026  Routing to scheduling team for follow-up on appt scheduling  Diane Benjamin, PharmD, BCPS Clinical Pharmacist  [facility]

## 2024-08-10 MED ORDER — BUDESONIDE-FORMOTEROL FUMARATE 80-4.5 MCG/ACT IN AERO: 2.0000 | INHALATION_SPRAY | Freq: Two times a day (BID) | RESPIRATORY_TRACT | 12 refills | Status: AC

## 2024-08-10 NOTE — Telephone Encounter (Signed)
 I have sent in symbicort inhaler 80-4.5mcg 2 puffs twice daily. - rinse mouth out after each use  JD

## 2024-08-11 ENCOUNTER — Telehealth: Payer: Self-pay

## 2024-08-11 NOTE — Telephone Encounter (Signed)
 Tried to reach out to patient VM/ LM - okay per DPR  New Rx sent to pharmacy

## 2024-08-25 ENCOUNTER — Encounter: Payer: Self-pay | Admitting: Pulmonary Disease

## 2024-08-26 ENCOUNTER — Telehealth: Payer: Self-pay

## 2024-08-27 NOTE — Telephone Encounter (Signed)
 addressed

## 2024-10-06 ENCOUNTER — Ambulatory Visit: Admitting: Pulmonary Disease
